# Patient Record
Sex: Female | Born: 1960
Health system: Southern US, Community
[De-identification: ages and names within clinical notes are randomized; demographics above are authoritative.]

## PROBLEM LIST (undated history)

## (undated) DIAGNOSIS — R51 Headache: Secondary | ICD-10-CM

## (undated) DIAGNOSIS — K219 Gastro-esophageal reflux disease without esophagitis: Secondary | ICD-10-CM

## (undated) DIAGNOSIS — F32A Depression, unspecified: Secondary | ICD-10-CM

## (undated) DIAGNOSIS — D689 Coagulation defect, unspecified: Secondary | ICD-10-CM

## (undated) DIAGNOSIS — E079 Disorder of thyroid, unspecified: Secondary | ICD-10-CM

## (undated) DIAGNOSIS — Z8489 Family history of other specified conditions: Secondary | ICD-10-CM

## (undated) DIAGNOSIS — D219 Benign neoplasm of connective and other soft tissue, unspecified: Secondary | ICD-10-CM

## (undated) DIAGNOSIS — R112 Nausea with vomiting, unspecified: Secondary | ICD-10-CM

## (undated) DIAGNOSIS — R200 Anesthesia of skin: Secondary | ICD-10-CM

## (undated) DIAGNOSIS — R519 Headache, unspecified: Secondary | ICD-10-CM

## (undated) DIAGNOSIS — F329 Major depressive disorder, single episode, unspecified: Secondary | ICD-10-CM

## (undated) DIAGNOSIS — I82409 Acute embolism and thrombosis of unspecified deep veins of unspecified lower extremity: Secondary | ICD-10-CM

## (undated) DIAGNOSIS — Z9889 Other specified postprocedural states: Secondary | ICD-10-CM

## (undated) DIAGNOSIS — R42 Dizziness and giddiness: Secondary | ICD-10-CM

## (undated) DIAGNOSIS — E039 Hypothyroidism, unspecified: Secondary | ICD-10-CM

## (undated) HISTORY — PX: TENDON RELEASE: SHX230

## (undated) HISTORY — PX: CHOLECYSTECTOMY: SHX55

## (undated) HISTORY — DX: Coagulation defect, unspecified: D68.9

## (undated) HISTORY — DX: Disorder of thyroid, unspecified: E07.9

## (undated) HISTORY — PX: ABDOMINAL HYSTERECTOMY: SHX81

## (undated) HISTORY — PX: TONSILLECTOMY: SHX5217

## (undated) HISTORY — PX: OOPHORECTOMY: SHX86

## (undated) HISTORY — PX: VENA CAVA FILTER PLACEMENT: SUR1032

---

## 1997-06-21 ENCOUNTER — Emergency Department (HOSPITAL_COMMUNITY): Admission: EM | Admit: 1997-06-21 | Discharge: 1997-06-21 | Payer: Self-pay | Admitting: Emergency Medicine

## 1997-06-21 ENCOUNTER — Inpatient Hospital Stay (HOSPITAL_COMMUNITY): Admission: RE | Admit: 1997-06-21 | Discharge: 1997-06-23 | Payer: Self-pay | Admitting: Internal Medicine

## 1997-07-25 ENCOUNTER — Ambulatory Visit (HOSPITAL_COMMUNITY): Admission: RE | Admit: 1997-07-25 | Discharge: 1997-07-25 | Payer: Self-pay | Admitting: Obstetrics and Gynecology

## 1997-08-08 ENCOUNTER — Inpatient Hospital Stay (HOSPITAL_COMMUNITY): Admission: RE | Admit: 1997-08-08 | Discharge: 1997-08-09 | Payer: Self-pay | Admitting: Orthopedic Surgery

## 1997-10-31 ENCOUNTER — Ambulatory Visit (HOSPITAL_BASED_OUTPATIENT_CLINIC_OR_DEPARTMENT_OTHER): Admission: RE | Admit: 1997-10-31 | Discharge: 1997-10-31 | Payer: Self-pay | Admitting: Orthopedic Surgery

## 1998-07-13 ENCOUNTER — Emergency Department (HOSPITAL_COMMUNITY): Admission: EM | Admit: 1998-07-13 | Discharge: 1998-07-14 | Payer: Self-pay | Admitting: Emergency Medicine

## 1998-08-07 ENCOUNTER — Encounter (INDEPENDENT_AMBULATORY_CARE_PROVIDER_SITE_OTHER): Payer: Self-pay

## 1998-08-07 ENCOUNTER — Inpatient Hospital Stay (HOSPITAL_COMMUNITY): Admission: RE | Admit: 1998-08-07 | Discharge: 1998-08-12 | Payer: Self-pay | Admitting: Obstetrics & Gynecology

## 1998-12-02 ENCOUNTER — Encounter: Admission: RE | Admit: 1998-12-02 | Discharge: 1999-02-10 | Payer: Self-pay | Admitting: Internal Medicine

## 1999-04-28 ENCOUNTER — Emergency Department (HOSPITAL_COMMUNITY): Admission: EM | Admit: 1999-04-28 | Discharge: 1999-04-28 | Payer: Self-pay | Admitting: *Deleted

## 1999-04-29 ENCOUNTER — Encounter: Payer: Self-pay | Admitting: *Deleted

## 1999-08-07 ENCOUNTER — Ambulatory Visit (HOSPITAL_COMMUNITY): Admission: RE | Admit: 1999-08-07 | Discharge: 1999-08-07 | Payer: Self-pay | Admitting: Obstetrics and Gynecology

## 1999-08-07 ENCOUNTER — Encounter (INDEPENDENT_AMBULATORY_CARE_PROVIDER_SITE_OTHER): Payer: Self-pay

## 2000-01-21 ENCOUNTER — Emergency Department (HOSPITAL_COMMUNITY): Admission: EM | Admit: 2000-01-21 | Discharge: 2000-01-21 | Payer: Self-pay | Admitting: Emergency Medicine

## 2000-01-22 ENCOUNTER — Encounter: Payer: Self-pay | Admitting: Emergency Medicine

## 2001-01-25 ENCOUNTER — Other Ambulatory Visit: Admission: RE | Admit: 2001-01-25 | Discharge: 2001-01-25 | Payer: Self-pay | Admitting: Obstetrics and Gynecology

## 2001-02-17 ENCOUNTER — Ambulatory Visit (HOSPITAL_COMMUNITY): Admission: RE | Admit: 2001-02-17 | Discharge: 2001-02-17 | Payer: Self-pay | Admitting: Obstetrics and Gynecology

## 2001-02-17 ENCOUNTER — Encounter (INDEPENDENT_AMBULATORY_CARE_PROVIDER_SITE_OTHER): Payer: Self-pay

## 2001-05-08 ENCOUNTER — Inpatient Hospital Stay (HOSPITAL_COMMUNITY): Admission: EM | Admit: 2001-05-08 | Discharge: 2001-05-12 | Payer: Self-pay | Admitting: Emergency Medicine

## 2001-05-09 ENCOUNTER — Encounter: Payer: Self-pay | Admitting: Internal Medicine

## 2001-06-19 ENCOUNTER — Emergency Department (HOSPITAL_COMMUNITY): Admission: EM | Admit: 2001-06-19 | Discharge: 2001-06-19 | Payer: Self-pay | Admitting: Emergency Medicine

## 2001-07-08 ENCOUNTER — Ambulatory Visit (HOSPITAL_COMMUNITY): Admission: RE | Admit: 2001-07-08 | Discharge: 2001-07-08 | Payer: Self-pay | Admitting: Internal Medicine

## 2001-07-08 ENCOUNTER — Encounter: Payer: Self-pay | Admitting: Internal Medicine

## 2001-07-20 ENCOUNTER — Ambulatory Visit: Admission: RE | Admit: 2001-07-20 | Discharge: 2001-07-20 | Payer: Self-pay | Admitting: Gynecology

## 2001-07-25 ENCOUNTER — Ambulatory Visit (HOSPITAL_COMMUNITY): Admission: RE | Admit: 2001-07-25 | Discharge: 2001-07-25 | Payer: Self-pay | Admitting: Internal Medicine

## 2001-07-26 ENCOUNTER — Ambulatory Visit (HOSPITAL_COMMUNITY): Admission: RE | Admit: 2001-07-26 | Discharge: 2001-07-26 | Payer: Self-pay | Admitting: Internal Medicine

## 2001-07-28 ENCOUNTER — Encounter: Payer: Self-pay | Admitting: Internal Medicine

## 2001-07-28 ENCOUNTER — Ambulatory Visit (HOSPITAL_COMMUNITY): Admission: RE | Admit: 2001-07-28 | Discharge: 2001-07-28 | Payer: Self-pay | Admitting: Internal Medicine

## 2001-08-09 ENCOUNTER — Ambulatory Visit: Admission: RE | Admit: 2001-08-09 | Discharge: 2001-08-09 | Payer: Self-pay | Admitting: Internal Medicine

## 2001-09-20 ENCOUNTER — Encounter: Payer: Self-pay | Admitting: Internal Medicine

## 2001-09-20 ENCOUNTER — Encounter: Admission: RE | Admit: 2001-09-20 | Discharge: 2001-09-20 | Payer: Self-pay | Admitting: Internal Medicine

## 2001-11-02 ENCOUNTER — Observation Stay (HOSPITAL_COMMUNITY): Admission: EM | Admit: 2001-11-02 | Discharge: 2001-11-03 | Payer: Self-pay | Admitting: *Deleted

## 2002-03-03 ENCOUNTER — Other Ambulatory Visit: Admission: RE | Admit: 2002-03-03 | Discharge: 2002-03-03 | Payer: Self-pay | Admitting: Internal Medicine

## 2002-03-27 ENCOUNTER — Emergency Department (HOSPITAL_COMMUNITY): Admission: EM | Admit: 2002-03-27 | Discharge: 2002-03-28 | Payer: Self-pay | Admitting: Emergency Medicine

## 2002-12-19 ENCOUNTER — Ambulatory Visit (HOSPITAL_COMMUNITY): Admission: RE | Admit: 2002-12-19 | Discharge: 2002-12-19 | Payer: Self-pay | Admitting: Internal Medicine

## 2003-04-05 ENCOUNTER — Emergency Department (HOSPITAL_COMMUNITY): Admission: AD | Admit: 2003-04-05 | Discharge: 2003-04-05 | Payer: Self-pay | Admitting: Family Medicine

## 2003-04-07 ENCOUNTER — Emergency Department (HOSPITAL_COMMUNITY): Admission: AD | Admit: 2003-04-07 | Discharge: 2003-04-07 | Payer: Self-pay | Admitting: Family Medicine

## 2003-04-09 ENCOUNTER — Encounter: Admission: RE | Admit: 2003-04-09 | Discharge: 2003-04-09 | Payer: Self-pay | Admitting: Internal Medicine

## 2003-04-11 ENCOUNTER — Encounter: Admission: RE | Admit: 2003-04-11 | Discharge: 2003-04-11 | Payer: Self-pay | Admitting: Internal Medicine

## 2003-08-11 ENCOUNTER — Emergency Department (HOSPITAL_COMMUNITY): Admission: EM | Admit: 2003-08-11 | Discharge: 2003-08-12 | Payer: Self-pay | Admitting: Emergency Medicine

## 2004-06-06 ENCOUNTER — Other Ambulatory Visit: Admission: RE | Admit: 2004-06-06 | Discharge: 2004-06-06 | Payer: Self-pay | Admitting: Internal Medicine

## 2004-09-07 ENCOUNTER — Emergency Department (HOSPITAL_COMMUNITY): Admission: EM | Admit: 2004-09-07 | Discharge: 2004-09-07 | Payer: Self-pay | Admitting: *Deleted

## 2004-09-08 ENCOUNTER — Ambulatory Visit (HOSPITAL_COMMUNITY): Admission: RE | Admit: 2004-09-08 | Discharge: 2004-09-08 | Payer: Self-pay | Admitting: *Deleted

## 2004-09-10 ENCOUNTER — Encounter: Admission: RE | Admit: 2004-09-10 | Discharge: 2004-09-10 | Payer: Self-pay | Admitting: Internal Medicine

## 2004-11-24 ENCOUNTER — Ambulatory Visit: Payer: Self-pay | Admitting: Oncology

## 2004-12-27 ENCOUNTER — Encounter: Admission: RE | Admit: 2004-12-27 | Discharge: 2004-12-27 | Payer: Self-pay | Admitting: Vascular Surgery

## 2005-01-26 ENCOUNTER — Ambulatory Visit (HOSPITAL_COMMUNITY): Admission: RE | Admit: 2005-01-26 | Discharge: 2005-01-26 | Payer: Self-pay | Admitting: Vascular Surgery

## 2005-02-02 ENCOUNTER — Emergency Department (HOSPITAL_COMMUNITY): Admission: EM | Admit: 2005-02-02 | Discharge: 2005-02-03 | Payer: Self-pay | Admitting: Emergency Medicine

## 2005-02-18 ENCOUNTER — Encounter: Admission: RE | Admit: 2005-02-18 | Discharge: 2005-02-18 | Payer: Self-pay | Admitting: Internal Medicine

## 2005-02-18 ENCOUNTER — Encounter (INDEPENDENT_AMBULATORY_CARE_PROVIDER_SITE_OTHER): Payer: Self-pay | Admitting: *Deleted

## 2005-02-28 ENCOUNTER — Inpatient Hospital Stay (HOSPITAL_COMMUNITY): Admission: AD | Admit: 2005-02-28 | Discharge: 2005-02-28 | Payer: Self-pay | Admitting: Gynecology

## 2005-03-11 ENCOUNTER — Ambulatory Visit: Payer: Self-pay | Admitting: Oncology

## 2005-08-17 ENCOUNTER — Encounter: Admission: RE | Admit: 2005-08-17 | Discharge: 2005-08-17 | Payer: Self-pay | Admitting: Internal Medicine

## 2005-10-25 ENCOUNTER — Emergency Department (HOSPITAL_COMMUNITY): Admission: EM | Admit: 2005-10-25 | Discharge: 2005-10-25 | Payer: Self-pay | Admitting: Emergency Medicine

## 2005-11-23 ENCOUNTER — Emergency Department (HOSPITAL_COMMUNITY): Admission: EM | Admit: 2005-11-23 | Discharge: 2005-11-23 | Payer: Self-pay | Admitting: Emergency Medicine

## 2005-12-21 ENCOUNTER — Ambulatory Visit (HOSPITAL_COMMUNITY): Admission: RE | Admit: 2005-12-21 | Discharge: 2005-12-21 | Payer: Self-pay | Admitting: General Surgery

## 2005-12-24 ENCOUNTER — Ambulatory Visit (HOSPITAL_COMMUNITY): Admission: RE | Admit: 2005-12-24 | Discharge: 2005-12-24 | Payer: Self-pay | Admitting: General Surgery

## 2005-12-30 ENCOUNTER — Encounter: Admission: RE | Admit: 2005-12-30 | Discharge: 2006-03-30 | Payer: Self-pay | Admitting: General Surgery

## 2005-12-31 ENCOUNTER — Emergency Department (HOSPITAL_COMMUNITY): Admission: EM | Admit: 2005-12-31 | Discharge: 2006-01-01 | Payer: Self-pay | Admitting: Emergency Medicine

## 2006-02-02 ENCOUNTER — Emergency Department (HOSPITAL_COMMUNITY): Admission: EM | Admit: 2006-02-02 | Discharge: 2006-02-02 | Payer: Self-pay | Admitting: Emergency Medicine

## 2006-04-06 ENCOUNTER — Ambulatory Visit (HOSPITAL_COMMUNITY): Admission: RE | Admit: 2006-04-06 | Discharge: 2006-04-07 | Payer: Self-pay | Admitting: General Surgery

## 2006-04-06 HISTORY — PX: LAPAROSCOPIC GASTRIC BANDING: SHX1100

## 2006-04-13 ENCOUNTER — Encounter: Admission: RE | Admit: 2006-04-13 | Discharge: 2006-07-12 | Payer: Self-pay | Admitting: General Surgery

## 2006-05-27 ENCOUNTER — Ambulatory Visit: Payer: Self-pay | Admitting: *Deleted

## 2006-05-27 ENCOUNTER — Emergency Department (HOSPITAL_COMMUNITY): Admission: EM | Admit: 2006-05-27 | Discharge: 2006-05-27 | Payer: Self-pay | Admitting: Emergency Medicine

## 2006-05-27 ENCOUNTER — Encounter (INDEPENDENT_AMBULATORY_CARE_PROVIDER_SITE_OTHER): Payer: Self-pay | Admitting: *Deleted

## 2006-08-23 ENCOUNTER — Inpatient Hospital Stay (HOSPITAL_COMMUNITY): Admission: AD | Admit: 2006-08-23 | Discharge: 2006-08-23 | Payer: Self-pay | Admitting: Obstetrics and Gynecology

## 2006-09-06 ENCOUNTER — Encounter: Admission: RE | Admit: 2006-09-06 | Discharge: 2006-09-06 | Payer: Self-pay | Admitting: General Surgery

## 2006-09-16 ENCOUNTER — Ambulatory Visit: Payer: Self-pay | Admitting: Gynecology

## 2006-11-08 ENCOUNTER — Inpatient Hospital Stay (HOSPITAL_COMMUNITY): Admission: RE | Admit: 2006-11-08 | Discharge: 2006-11-11 | Payer: Self-pay | Admitting: Gynecology

## 2006-11-08 ENCOUNTER — Encounter (INDEPENDENT_AMBULATORY_CARE_PROVIDER_SITE_OTHER): Payer: Self-pay | Admitting: Gynecology

## 2006-11-08 ENCOUNTER — Ambulatory Visit: Payer: Self-pay | Admitting: Gynecology

## 2006-11-18 ENCOUNTER — Ambulatory Visit: Payer: Self-pay | Admitting: Obstetrics & Gynecology

## 2006-12-05 ENCOUNTER — Inpatient Hospital Stay (HOSPITAL_COMMUNITY): Admission: AD | Admit: 2006-12-05 | Discharge: 2006-12-05 | Payer: Self-pay | Admitting: Gynecology

## 2006-12-10 ENCOUNTER — Ambulatory Visit: Payer: Self-pay | Admitting: Gynecology

## 2006-12-21 ENCOUNTER — Ambulatory Visit (HOSPITAL_COMMUNITY): Admission: RE | Admit: 2006-12-21 | Discharge: 2006-12-21 | Payer: Self-pay | Admitting: Gynecology

## 2006-12-24 ENCOUNTER — Encounter: Admission: RE | Admit: 2006-12-24 | Discharge: 2006-12-24 | Payer: Self-pay | Admitting: Gynecology

## 2007-01-09 ENCOUNTER — Emergency Department (HOSPITAL_COMMUNITY): Admission: EM | Admit: 2007-01-09 | Discharge: 2007-01-09 | Payer: Self-pay | Admitting: Emergency Medicine

## 2007-03-23 ENCOUNTER — Emergency Department (HOSPITAL_COMMUNITY): Admission: EM | Admit: 2007-03-23 | Discharge: 2007-03-24 | Payer: Self-pay | Admitting: Family Medicine

## 2007-05-23 ENCOUNTER — Encounter: Admission: RE | Admit: 2007-05-23 | Discharge: 2007-05-23 | Payer: Self-pay | Admitting: Internal Medicine

## 2007-06-05 ENCOUNTER — Emergency Department (HOSPITAL_COMMUNITY): Admission: EM | Admit: 2007-06-05 | Discharge: 2007-06-06 | Payer: Self-pay | Admitting: Emergency Medicine

## 2008-08-31 ENCOUNTER — Emergency Department (HOSPITAL_COMMUNITY): Admission: EM | Admit: 2008-08-31 | Discharge: 2008-08-31 | Payer: Self-pay | Admitting: Family Medicine

## 2008-10-24 ENCOUNTER — Encounter: Admission: RE | Admit: 2008-10-24 | Discharge: 2008-10-24 | Payer: Self-pay | Admitting: General Surgery

## 2009-07-18 ENCOUNTER — Ambulatory Visit (HOSPITAL_COMMUNITY): Admission: RE | Admit: 2009-07-18 | Discharge: 2009-07-18 | Payer: Self-pay | Admitting: Internal Medicine

## 2010-02-09 ENCOUNTER — Encounter: Payer: Self-pay | Admitting: Gynecology

## 2010-02-09 ENCOUNTER — Encounter: Payer: Self-pay | Admitting: Internal Medicine

## 2010-04-26 LAB — POCT URINALYSIS DIP (DEVICE)
Glucose, UA: NEGATIVE mg/dL
Nitrite: NEGATIVE
Urobilinogen, UA: 0.2 mg/dL (ref 0.0–1.0)
pH: 7 (ref 5.0–8.0)

## 2010-04-26 LAB — POCT I-STAT, CHEM 8
Creatinine, Ser: 0.8 mg/dL (ref 0.4–1.2)
HCT: 44 % (ref 36.0–46.0)
Sodium: 139 mEq/L (ref 135–145)
TCO2: 28 mmol/L (ref 0–100)

## 2010-06-03 NOTE — Discharge Summary (Signed)
Laura Molina, Laura Molina NO.:  [id number]   MEDICAL RECORD NO.:  [id number]          PATIENT TYPE:  INP   LOCATION:  9312                          FACILITY:  [facility]   PHYSICIAN:  Ginger Carne, MD  DATE OF BIRTH:  11-26-1960   DATE OF ADMISSION:  11/08/2006  DATE OF DISCHARGE:  11/11/2006                               DISCHARGE SUMMARY   REASON FOR HOSPITALIZATION:  Chronic left lower quadrant pain, retained  left ovarian syndrome.   PROCEDURES:  Laparotomy followed by left oophorectomy, placement of left  ureteral stent by cystoscopy and repair of umbilical hernia.   DISCHARGE DIAGNOSES:  1. Laparotomy followed by left oophorectomy, placement of left      ureteral stent by cystoscopy.  2. Umbilical hernia, repaired.   HOSPITAL COURSE:  This patient is a 50 year old, Hispanic female who  underwent the aforementioned surgery on November 08, 2006.  She had a  previous hysterectomy and right salpingo-oophorectomy.  The patient's  intraoperative course was uncomplicated.  She did have a 6 cm,  multicystic, left ovary significantly adherent to the rectosigmoid colon  and pelvic sidewall.  The pathology report demonstrated benign cyst.  Her lap band was in place and her umbilical hernia was repaired with  interrupted, 1-Prolene sutures without the necessity of a graft.  She  has a history of two deep venous thromboses and had been on Coumadin  prophylaxis prior to surgery.   Her postoperative course was remarkable.  She was afebrile.  Her abdomen  was soft and incision dry.  She had flatus on discharge, but no bowel  movement and she had good bowel sounds.  Her calves were without  tenderness and lungs were clear.  She was started on alternating regimen  of 7.5 mg and 5 mg respectively of Coumadin every night.  At the time of  her discharge, her INR was 1.5.  Her hemoglobin was 10.4.  She was also  placed on Lovenox at 1 mg/kg subcutaneously in two divided doses  every  12 hours.   The patient was discharged with instructions to take 7.5 mg of Coumadin  this evening and she has an appointment with Dr. Nehemiah Settle at 2:45 p.m.  tomorrow, November 12, 2006.  At that time, management of her Lovenox and  Coumadin and INR testing will be taken over.  She was instructed to  continue on Lovenox 40 mg subcutaneously every 12 hours until further  instructions by Dr. Nehemiah Settle.  Lortab elixir 7.5/500 was provided at 15 mL  q.8 h. as needed x5 days with one refill.   SPECIAL INSTRUCTIONS:  Routine postoperative instructions were provided  including contacting the office for temperature elevation above 100.4  degrees Fahrenheit, increasing abdominal pain, incisional drainage,  bleeding or significant erythema and wound separation.  In addition,  increased calf pain and/or abdominal pain.  GI and GU complaints were  also discussed and the patient advised to contact the office for same.   FOLLOW UP:  She will be seen in the GYN clinic for which she will make  an appointment on October 30, to have her  staples removed and then a 4-  week postoperative visit.   ACTIVITY:  She was advised to avoid heavy lifting and driving x1 week.   All questions answered to the satisfaction of said patient and the  patient verbalized understanding of same.      Ginger Carne, MD  Electronically Signed     SHB/MEDQ  D:  11/11/2006  T:  11/12/2006  Job:  [id number]

## 2010-06-03 NOTE — Op Note (Signed)
NAMEMEDA, DUDZINSKI NO.:  [id number]   MEDICAL RECORD NO.:  [id number]          PATIENT TYPE:  INP   LOCATION:  9312                          FACILITY[facility]  [facility]   PHYSICIAN:  Ginger Carne, MD  DATE OF BIRTH:  13-Mar-1960   DATE OF PROCEDURE:  11/08/2006  DATE OF DISCHARGE:                               OPERATIVE REPORT   PREOPERATIVE DIAGNOSIS:  Retained left ovarian syndrome, chronic pelvic  pain.   POSTOPERATIVE DIAGNOSIS:  Retained left ovarian syndrome, chronic pelvic  pain, and umbilical hernia.   PROCEDURE:  Laparotomy with a left oophorectomy, placement of ureteral  catheter via cystoscopy, and repair of umbilical hernia.   SURGEON:  Dr. Mia Creek.   ASSISTANT:  Dr. Okey Dupre.   ESTIMATED BLOOD LOSS:  200 mL.   COMPLICATIONS:  None immediate.   SPECIMEN:  Left ovary to pathology.   OPERATIVE FINDINGS:  The patient had a previous excised right tube and  ovary and uterus and cervix.  The left ovary was densely adherent to its  respective sidewall.  It measured approximately 6X4X4 cm. without  surface excrescenses. It was multilocular and no ascites noted. Pelvic  and para aortic nodes were palpated and not enlarged. The left ureter  was always in view during the critical portions of the dissection of the  left ovary and clamping of vessels.  The patient had a 4-cm umbilical  hernia.  The lap band that the patient had placed previously was noted.  Minimal dissection to free up adhesions of the excess portion of the  band from the incisional hernia site was required.  The remainder of the  large and small bowel was grossly normal.  There was adherence of the  rectosigmoid colon to the left ovary.   OPERATIVE PROCEDURE:  The patient prepped and draped in usual fashion  and placed in lithotomy position.  Betadine solution used for  antiseptic, and the patient was catheterized prior to the procedure.  After adequate general anesthesia, a cystoscopy was  performed.  The  urethra and bladder appeared normal.  A #5-French straight ureteral  catheter was placed through the left ureteral orifice and was  facilitated by the placement of a guidewire first.  A Foley catheter was  then introduced through the urethra into the bladder and the ureteral  catheter affixed to the Foley catheter during the operative portion of  the procedure.  The ureteral catheter was removed and the Foley replaced  at the end of the procedure.  Following this, the patient reprepped and  positioned, and a vertical infraumbilical incision was made and the  abdomen opened.  Self-retaining retractors were utilized.  The lateral  wall of the left pelvis was opened, and the ureter was immediately  identified both by visualization and by palpation.  Following this, the  left infundibulopelvic ligament was identified after dissection of bowel  from the left ovary.  Two-0 Vicryl suture ties were placed as a  transfixation method around the left IP ligament.  The ligament was cut  on the ovary side.  Careful sharp and blunt dissection was then carried  out to remove said ovary.  Bleeding points were meticulously checked.  The umbilical hernia was repaired by opening said hernia, removing the  umbilical sac and using a series of 1 Prolene interrupted sutures.  Afterwards, copious irrigation with lactated Ringer's was used, irrigant  removed.  As mentioned above, the previously placed lap band around the  stomach was identified and no injury to said structure noted.  Using a  double looped 0 PDS running suture for closure of the fascia.  More  irrigation for the subcutaneous layer and closure with staples for the  skin.  The patient tolerated the procedure well, returned to the post-  anesthesia recovery room in excellent condition.      Ginger Carne, MD  Electronically Signed     SHB/MEDQ  D:  11/08/2006  T:  11/09/2006  Job:  [id number]

## 2010-06-06 NOTE — Op Note (Signed)
NAMEZAKARIA, FROMER NO.:  [id number]   MEDICAL RECORD NO.:  [id number]          PATIENT TYPE:  OIB   LOCATION:  1516                         FACILITY:  [facility]   PHYSICIAN:  Sharlet Salina T. Hoxworth, M.D.DATE OF BIRTH:  12/14/1960   DATE OF PROCEDURE:  04/06/2006  DATE OF DISCHARGE:                               OPERATIVE REPORT   PREOPERATIVE DIAGNOSIS:  Morbid obesity.   POSTOPERATIVE DIAGNOSIS:  Morbid obesity and hiatal hernia.   SURGICAL PROCEDURES:  Laparoscopic adjustable gastric band placement  with hiatal hernia repair.   SURGEON:  Lorne Skeens. Hoxworth, M.D.   ASSISTANT:  Sandria Bales. Ezzard Standing, M.D.   ANESTHESIA:  General.   BRIEF HISTORY:  Ms. Klich is a 50 year old female with progressive  morbid obesity unresponsive to medical management.  Following a thorough  preoperative evaluation and workup detailed elsewhere, we have elected  to proceed with placement of laparoscopic adjustable gastric band.  Her  preoperative upper GI series did indicate a small sliding hiatal hernia.   DESCRIPTION OF OPERATION:  The patient was brought to the operating room  and placed in the supine position on the operating table and general  orotracheal anesthesia was induced.  The abdomen was widely sterilely  prepped and draped.  The correct patient and procedure were verified.  She had been given preoperative IV antibiotics and Lovenox.  Local  anesthesia was used to infiltrate the trocar sites.  Access was obtained  with the 11 mm OptiVu trocar in the left lower quadrant and  pneumoperitoneum established.  Under direct vision, a 15 mm trocar was  placed through the falciform ligament in the right upper quadrant, an 11  mm trocar in the right mid abdomen, another 11 mm trocar in the left  periumbilical space for the camera port, and a 5 mm trocar in the left  flank.  Through a 5 mm site in the epigastrium, the Nathanson retractor  was placed and the left lobe of the  liver elevated with excellent  exposure of the hiatus and stomach.  Initially, the angle of His was  dissected and the left crus identified and blunt dissection carried down  along the left crus toward the retrogastric space.  The pars flaccida  was then opened in the avascular area and the base of the right crus  identified.  The calibration tube was then passed into the stomach.  The  balloon was inflated to 15 mm and pulled back and did go up through the  hiatus indicating a small hiatal hernia.  The crura were then dissected  posteriorly and the posterior confluence of the left and right crura  were identified and above this was a small hiatal hernia.  This was  repaired with a single 0 Ethibond pledgeted suture.  The calibration  tube was then passed back down through the hiatus without difficulty.  It was pulled back to the esophagus and the dissector was passed in the  retrogastric space anterior to the base of the right crus and the tip  brought out through the previously dissected area along the angle of His  without  difficulty.  An AP standard lap band system was introduced,  tubing passed into the finger dissector, and then brought back behind  the stomach, and the band brought back behind the stomach without  difficulty.  The tubing was passed through the band and with the  calibration tube in place, the band was locked without difficulty.  The  calibration tube was removed without difficulty.  Holding the tubing  toward the patient's right foot, the fundus was imbricated up over the  band to a small gastric pouch with several interrupted 2-0 Ethilon  sutures.  The band appeared to be in an excellent position.  Hemostasis  was assured.  The Nathanson retractor was removed and the tube brought  out through the right mid abdominal site and the trocars and all CO2  removed.  The incision was lengthened somewhat for placement of the port  and the anterior abdominal wall exposed.   Four 2-0 Prolene sutures were  placed and after attaching the port to the tubing, it was sutured to the  anterior abdominal wall with these sutures.  The wounds were irrigated.  The skin closed with staples.  Sponge, needle, and instrument counts  were correct.  Dry, sterile dressings were applied and the patient taken  to recovery in good condition.      Lorne Skeens. Hoxworth, M.D.  Electronically Signed     BTH/MEDQ  D:  04/06/2006  T:  04/06/2006  Job:  [id number]

## 2010-06-06 NOTE — Op Note (Signed)
[facility]  Patient:    Laura Molina, Laura Molina Visit Number: [id number] MRN: [id number]          Service Type: DSU Location: [facility] Attending Physician:  Osborn Coho Dictated by:   Janeece Riggers Dareen Piano, M.D. Proc. Date: 02/17/01 Admit Date:  02/17/2001                             Operative Report  PREOPERATIVE DIAGNOSES:       1. Left ovarian cyst.                               2. Left lower quadrant pain.  SURGEON:                      Mark E. Dareen Piano, M.D.  ANESTHESIA                    General endotracheal.  ANTIBIOTICS:                  Ancef 1 g.  DRAINS:                       Red rubber catheter to bladder.  COMPLICATIONS:                None.  SPECIMEN:                     Left ovarian cyst.  PROCEDURE:                    Patient was taken to the operating room where she was placed in a dorsal supine position.  A general endotracheal anesthetic was administered.  She was then placed in the dorsal lithotomy position and prepped with Hibiclens.  Her bladder was drained with a Red rubber catheter. She was then draped in the usual fashion for this procedure.  Her umbilicus was injected with 0.25% Marcaine.  A vertical skin incision was made.  This was carried down to the fascia.  Fascia was grasped with Allis clamps and an incision was made.  Parietoperitoneum was entered bluntly.  The Hasson cannula was placed into the abdominal cavity and the abdominal cavity was then insufflated with 3 L of carbon dioxide.  At this point the patient was noted to have adhesions throughout the entire lower abdominal wall.  This involved one loop of small bowel.  These were all taken down with sharp dissection. Once this was accomplished the ovarian cyst on the left could be identified. The ovary was densely adherent to the pelvic side wall and puncture sites in the right and left lower quadrants were made using a 5 mm trocar.  At this point instruments  were used to grasp the ovary and dissect away the colon from the ovary.  Once this was accomplished there were still areas of omentum which were stuck to the ovary which were also dissected away.  The ovary was also densely adherent to the pelvic side wall superiorly and inferiorly.  Despite extensive attempts at trying to identify the infundibulopelvic ligament and free the adhesions, this was unsuccessful.  Because the patient had previously been given heparin, several areas of oozing made it more difficult to complete the dissection.  Also, the ovary appeared to be adherent to the iliac vessel making dissection even  more difficult.  It was felt that further dissection would put the patient in jeopardy and therefore an ovarian cystectomy was performed and the ovary left in place.  At this point the procedure was concluded, the instruments removed, pneumoperitoneum released.  Fascia was closed with 0 Vicryl suture and the skin incision with 4-0 Vicryl suture. Patient tolerated the procedure well.  She was taken to the recovery room in stable condition.  She will be discharged to home with Lovenox 40 mg subcutaneously b.i.d. for three days and Tylox to take p.r.n.  She will follow up in the office in four weeks. Dictated by:   Janeece Riggers Dareen Piano, M.D. Attending Physician:  Osborn Coho DD:  02/17/01 TD:  02/17/01 Job: [id number]

## 2010-06-06 NOTE — H&P (Signed)
[place]  Patient:    Laura Molina, Laura Molina Visit Number: [id number] MRN: [id number]          Service Type: MED Location: [id number] Attending Physician:  Mayra Neer Dictated by:   Lester Kinsman, M.D. Admit Date:  05/08/2001 Discharge Date: 05/12/2001                           History and Physical  DATE OF BIRTH:  1960-07-20.  PRIMARY PHYSICIAN:  Lilla Shook, M.D.  EAGLE PHYSICIAN:  Sigmund I. Patsi Sears, M.D. as the primary.  PROBLEM LIST: 1. Left lower extremity deep venous thrombosis confirmed by duplex    ultrasound showing a DVT in the superficial femoral vein and midway of    the popliteal vein.  No evidence of superficial vena thrombosis or    Baker cyst.    A. History of DVT in 1989, 1999, and 2000. 2. Morbid obesity. 3. Hypothyroidism. 4. History of peptic ulcer disease. 5. Status post laparoscopic ovarian cyst removal more than six months ago.  HISTORY OF PRESENT ILLNESS:  This 50 year old Hispanic female, with a history of DVTs in the past, who presents with a swollen, tender left lower extremity that has been progressively worse over the last three days.  She denies any shortness of breath or chest pain.  No trauma to the area.  She has no history of recent prolonged travel.  She has no history of clotting disorders, either personally or in her family.  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  PAST MEDICAL HISTORY AND SURGICAL HISTORY:  Per her problem list.  MEDICATIONS:  She is on Synthroid.  ALLERGIES:  IBUPROFEN which causes a rash, and DARVOCET which causes jitteriness.  SOCIAL HISTORY:  She is married.  She has four children.  She works as a Occupational psychologist.  She does not drink or smoke.  She does not use drugs.  FAMILY HISTORY:  No history of clotting of bleeding disorder.  No history of miscarriages.  REVIEW OF SYSTEMS:  No chest pain, shortness of  breath.  No bowel or bladder dysfunction.  No headache.  No miscarriages.  She does have a 14-pound weight loss over the last two months, but she is on a Weight Watchers program and is exercising regularly.  PHYSICAL EXAMINATION:  VITAL SIGNS:  Temperature 97 degrees, BP 166/86, pulse 59, respirations 18, O2 saturation is 96% on room air.  GENERAL:  She is an obese, Hispanic female in no acute distress, lying in bed.  HEENT:  Oropharynx is clear.  EOMI.  Pupils are reactive.  NECK:  Thick and supple with no bruits appreciated.  CARDIOVASCULAR:  Regular rate and rhythm.  No murmurs.  Normal S1 and S2.  LUNGS:  Clear to auscultation bilaterally.  ABDOMEN:  Obese, nontender.  Active bowel sounds.  EXTREMITIES:  Left lower extremity is swollen.  It is tender and warm.  She has a positive Homans sign.  Right extremity is normal.  NEUROLOGIC:  Examination is nonfocal.  She moves all four of her extremities.  GASTROINTESTINAL:  Guaiac negative.  LABORATORY DATA:  Sodium 138, potassium 3.6, chloride 104, CO2 of 25, BUN 12, creatinine 0.9, glucose 106.  White count is 9.9 thousand, hemoglobin 14.0, platelets 273.  PT 13.3, INR 1.0.  As noted a duplex ultrasound of the left lower extremity shows a DVT in the ______ , SFV, and  the popliteal vein.  No evidence of superficial vena thrombosis or Baker cyst.  ASSESSMENT AND PLAN: 1. Deep venous thrombosis.  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  She    has no obvious risk factors; for example, recent surgery, pregnancy,    trauma, or immobility, but she does sit all day long at her desk job.    However, she walks 2-3 miles a day three days a week in conjunction with    her Weight Watchers loss program.  She has no estrogen use.  There is a    question as to whether this is a lupus anticoagulant or anticardiolipin    syndrome.  She does not have, however, a  history of miscarriages or other    clotting disorders.  PLAN: 1. IV heparin, pharmacy to dose. 2. We will start Coumadin in the next one to three days for a target INR of    2-3. 3. Pain control. 4. We will check a protein C and protein S, antithrombin 3 and anticardiolipin    antibody, as well as factor V Leiden.  Dr. Lovell Sheehan will follow up in the    morning and follow up with the labs. Dictated by:   Lester Kinsman, M.D. Attending Physician:  Mayra Neer DD:  05/09/01 TD:  05/09/01 Job: [id number]

## 2010-06-06 NOTE — Op Note (Signed)
[facility]  Patient:    Laura Molina, Laura Molina                      MRN: [id number] Proc. Date: 08/07/99 Adm. Date:  [id number] Disc. Date: [id number] Attending:  Osborn Coho                           Operative Report  PREOPERATIVE DIAGNOSIS:  Chronic right lower quadrant pain.  POSTOPERATIVE DIAGNOSIS:  Chronic right lower quadrant pain.  PROCEDURES: 1. Open laparoscopy. 2. Lysis of extensive adhesions involving small bowel and omentum. 3. Right oophorectomy.  SURGEON:  Mark E. Dareen Piano, M.D.  ANESTHESIA:  General endotracheal.  ESTIMATED BLOOD LOSS:  Minimal.  COMPLICATIONS:  None.  SPECIMENS:  Right ovary sent to pathology.  PREOPERATIVE MEDICATIONS: 1. Ancef 1 gm. 2. Heparin 5000 units subcu.  FINDINGS:  The patient had a loop of small bowel adherent to the anterior abdominal wall near the umbilicus.  There was also a large amount of omentum adherent to this same area.  In the pelvis, the patient had several loops of bowel adherent to the cul-de-sac and anterior abdominal wall in the midline. There was also a 3-cm cyst of the right ovary which was adherent to the right pelvic side wall.  The left ovary was adherent to the pelvic side wall and appeared to be normal.  The liver appeared to be normal.  There was no evidence of any endometriosis.  DESCRIPTION OF PROCEDURE:  The patient was taken to the operating room where she was placed in a dorsal supine position.  A general endotracheal anesthetic was administered without complications.  She was then placed in the dorsolithotomy position and prepped with Hibiclens.  A Foley catheter was placed in the bladder.  A vertical skin incision was made through the previous umbilical scar.  This was carried down the fascia.  The fascia was entered vertically, and two sutures of #0 Vicryl were placed laterally.  The parietal peritoneum was entered sharply.  The Hasson trocar was then placed  in the abdominal cavity.  The patient was placed in Trendelenburg, and three liters of carbon dioxide was insufflated.  The scope was placed, and as noted, there were extensive adhesions around the umbilicus.  These were all taken down with sharp dissection.  Next, a 5-mm port was placed in the suprapubic region and in the left lower quadrant.  The extensive adhesions involving the small bowel and omentum to the cul-de-sac and anterior abdominal wall were all taken down sharply.  Once this was accomplished, the right ovary was seen to be adherent to the pelvic side wall with a 3-cm cyst.  The adhesions holding the ovary to the pelvic side wall were all lysed, and the ovary was freed.  The infundibular pelvic ligament was then isolated, cauterized, and transected. Once the ovary was freed, it was removed through the umbilical port.  Copious irrigation was then performed, and hemostasis appeared to be adequate.  At this point, the left ovary was identified which was adherent to the side wall. It appeared to be normal.  After this, Intercede was placed in the right and left pelvic side walls in an attempt to decrease adhesion formation.  At this point, the procedure was concluded.  The instruments were removed.  The fascia was closed with interrupted #0 Vicryl sutures, and the skin was closed with 4-0 Vicryl suture in a subcuticular fashion.  The patient was taken to the recovery room in stable condition.  She will be discharged to home.  She will be continued on lovenox for three more days.  She will be sent home with Tylox to take p.r.n. DD:  08/07/99 TD:  08/09/99 Job: [id number]

## 2010-06-06 NOTE — Discharge Summary (Signed)
[facility]  Patient:    Laura Molina, Laura Molina Visit Number: [id number] MRN: [id number]          Service Type: MED Location: [id number] Attending Physician:  Mayra Neer Dictated by:   Lilla Shook, M.D. Admit Date:  05/08/2001 Discharge Date: 05/12/2001                             Discharge Summary  DATE OF BIRTH: [birth year].  DISCHARGE DIAGNOSES 1. Left lower extremity deep venous thrombosis. 2. Morbid obesity. 3. Hypothyroidism. 4. History of peptic ulcer disease. 5. Status post laparoscopic ovarian cyst removal.  HISTORY OF PRESENT ILLNESS: The patient is a 50 year old woman with a history of hypothyroidism, morbid obesity and left lower extremity DVT x 3 who was admitted with a chief complaint of a swollen tender left lower extremity. This had gotten progressively worse over the three days prior to admission. She denied any shortness of breath or chest pain, as well as no trauma to the area. She had no history of prolonged travel, nor a history of clotting disorder, personally nor in her family. 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  She has a history of three prior DVTs which were thought to be secondary to compression of an iliac vein by an ovarian cyst. There was an attempt at dissection of this ovary from the wall of her pelvis and from this vein during an ovarian cyst procedure, but secondary to the close adherence, it is likely that it was unable to be dissected. She has been on Coumadin for each of her DVTs in the past for up to six months. She has never had a pulmonary embolus.  PAST MEDICAL HISTORY 1. Morbid obesity. 2. Hypothyroidism. 3. History of peptic ulcer disease. 4. Status post laparoscopic ovarian cyst removal more than six months ago with attempt at dissection from the iliac vein.  MEDICATIONS: Synthroid.  ALLERGIES: IBUPROFEN CAUSES A RASH AND DARVOCET CAUSES  JITTERINESS.  SOCIAL HISTORY: She is married with four children. She is a Occupational psychologist. She does not drink nor smoke and uses no drugs.  FAMILY HISTORY: No history of clotting or bleeding disorders. No family history of miscarriages.  REVIEW OF SYSTEMS: Per the template, otherwise only remarkable for serious attempts at weight loss over the past two months, and currently using the Weight Watchers program.  SIGNIFICANT FINDINGS: Left lower extremity with deep venous thrombosis confirmed by duplex ultrasound, found in the superficial femoral vein and midway up the popliteal vein. No evidence of Bakers cyst.  LABORATORY DATA: INR 1.0. BUN 12, creatinine 0.9. WBC 9.9.  HOSPITAL COURSE: The patient was admitted with a diagnosis of an acute and chronic DVT and was started Lovenox, started heparin and was started on Coumadin a few days afterwards. Coagulation studies for factor V Leiden, protein C, protein S, were completed with all normal except for having a low protein S. It was not clear whether she had the protein S level drawn prior to anticoagulation. Her left extremity was elevated and she was released with oral Coumadin therapy as well as Lovenox injection as her INR was only 1.1 on discharge. She was on Lovenox 1.5 mg/kg per day.  She was to follow up as an outpatient with me.  DISCHARGE INSTRUCTIONS: She went home with Lovenox injections and received Lovenox teaching before leaving the hospital. She  was on Coumadin 5 mg q.d. before leaving and was to receive a check of her PT and INR the day following discharge and also on a regular basis thereafter. She was to continue elevating her leg and watching for symptoms such as shortness of breath, chest pain and tachycardia.  DISCHARGE MEDICATIONS: She was to resume her home medications which included Synthroid. Dictated by:   Lilla Shook, M.D. Attending Physician:  Mayra Neer DD:  06/16/01 TD:   06/18/01 Job: [id number]

## 2010-06-06 NOTE — H&P (Signed)
[name], Molina NO.:  [id number]   MEDICAL RECORD NO.:  [id number]                   PATIENT TYPE:  OBV   LOCATION:  5707                                 FACILITY:  [facility]   PHYSICIAN:  Theressa Millard, M.D.                 DATE OF BIRTH:  04-13-1960   DATE OF ADMISSION:  11/02/2001  DATE OF DISCHARGE:                                HISTORY & PHYSICAL   HISTORY OF PRESENT ILLNESS:  The patient is a 50 year old Hispanic female  admitted for observation for leg pain.  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  These occurred in 1989, 1999, 2000 and in April of 2003.  They  have all involved the left leg.  She was treated for six months with  Coumadin each time. She is still on Coumadin and she is checked regularly.  She has had some work-up in the past but the patient is unclear about the  results of this in terms of thrombophilia.  For the last two days, she has  had progressive pain and swelling in the left leg.  It seems to be bruised.  She reports a Doppler proven superficial thrombophlebitis in June of this  year while on Coumadin.   PAST MEDICAL HISTORY:  Hypothyroidism.   PAST SURGICAL HISTORY:  C-section x3.  Hysterectomy for fibroids.  Unilateral salpingo-oophorectomy.  Tennis elbow surgery x2.  Cholecystectomy.   ALLERGIES:  CODEINE, DARVOCET, IBUPROFEN.   MEDICATIONS:  1. Synthroid 0.15 mg daily.  2. Coumadin 5 alternating with 7.5 mg daily.   SOCIAL HISTORY:  The patient does not smoke or consume alcohol.  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   FAMILY HISTORY:  Negative for DVT.   REVIEW OF SYSTEMS:  No shortness of breath.  All other systems are negative.   PHYSICAL EXAMINATION:  GENERAL APPEARANCE:  A well-developed, well-  nourished, in no distress.  VITAL SIGNS:  Pulse 72.  LUNGS:  Clear to percussion and auscultation.  EXTREMITIES:  There is superficial tenderness along the medial left lower  leg and the left thigh.   Left calf is mildly enlarged compared to the right.   INR is 2.5.   IMPRESSION:  1. Leg pain.  Possible superficial thrombophlebitis, rule out deep venous     thrombosis while on Coumadin.  2. History of deep venous thrombosis.  3. Optimally anticoagulated.  4. Hypothyroidism.    PLAN:  The patient will be brought in for observation, given subcutaneous  Lovenox pain medications and an ultrasound Doppler will be obtained in the  morning.                                               Theressa Millard, M.D.  JO/MEDQ  D:  11/02/2001  T:  11/03/2001  Job:  [id number]

## 2010-06-06 NOTE — Consult Note (Signed)
[facility]  Patient:    Laura Molina, Laura Molina Visit Number: [id number] MRN: [id number]          Service Type: GON Location: GYN Attending Physician:  Jeannette Corpus Dictated by:   Rande Brunt. Clarke-Pearson, M.D. Proc. Date: 07/20/01 Admit Date:  07/20/2001   CC:         Loraine Leriche E. Dareen Piano, M.D.  Lilla Shook, M.D.  Telford Nab, R.N.   Consultation Report  NEW PATIENT CONSULTATION  REASON FOR CONSULTATION:  A 50 year old white female seen in consultation at the request of Loraine Leriche E. 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  HISTORY OF PRESENT ILLNESS:  The patient reports that she initially developed deep vein thrombosis in 1989 immediately following a cesarean section of her only child.  She apparently was treated with anticoagulants for six months and then was followed until 1999, when she developed recurrent deep vein thrombosis.  She has had subsequent recurrence in 2000 and most recently a superficial thrombophlebitis.  The patient is currently on Coumadin.  From a gynecologic point of view, the patient underwent a vaginal hysterectomy for dysmenorrhea in 2000.  Subsequently she underwent open laparoscopy with extensive lysis of adhesions and a right oophorectomy in July 2001 for treatment of right lower quadrant chronic pain.  She was found to have essentially no significant pathology in that ovary.  Subsequent to this the patient underwent further surgical procedure evaluating a left ovarian cyst and left lower quadrant pain, undergoing laparoscopic ovarian cystectomy February 17, 2001.  The cyst turned out to be a corpus luteum, and her ovary was densely adherent to the pelvic sidewall.  At the time of the patients most recent thrombophlebitis (July 08, 2001), an MRI was obtained which showed a multicystic lesion of the left ovary with a dominant cystic  component measuring 3 x 2.3 x 2.6 cm, compatible with a hemorrhagic cyst.  There were some venous varicosities associated with the ovary.  There was no comment in the MRI report as to any evidence of venous obstruction of the external iliac or common iliac vein.  The patient is seen today in consultation as to whether removing the other ovary might alleviate her deep vein thrombosis or prevent further problems.  The patients hospitalization notes from her recent hospitalization are reviewed.  PAST MEDICAL HISTORY: 1. Morbid obesity. 2. Left lower deep vein thrombosis. 3. Hypothyroidism. 4. History of peptic ulcer disease.  PAST SURGICAL HISTORY: 1. Laparoscopic ovarian cystectomy. 2. Vaginal hysterectomy. 3. Right salpingo-oophorectomy.  MEDICATIONS:  Synthroid and Coumadin.  DRUG ALLERGIES:  IBUPROFEN, DARVOCET.  SOCIAL HISTORY:  The patient is married.  She has four children.  She is a Occupational psychologist.  She does not smoke or drink.  FAMILY HISTORY:  Negative for clotting disorders, ovarian cancer, or other GYN cancers.  REVIEW OF SYSTEMS:  Pain in the left lower extremity and left lower quadrant, which is moderate to mild in nature.  She has no other GI or GU symptoms, has no vaginal bleeding.  PHYSICAL EXAMINATION:  VITAL SIGNS:  Height 5 feet, weight 227 pounds, blood pressure 118/80, pulse 80, respiratory rate 18.  GENERAL:  The patient is a very obese white female who is quite knowledgeable regarding her medical history and in no acute distress.  HEENT:  Negative.  NECK:  Supple without thyromegaly.  LYMPHATIC:  There is no supraclavicular or inguinal adenopathy.  ABDOMEN:  Obese, soft, nontender.  No  mass, organomegaly, ascites, or hernias are noted unless there is an umbilical hernia, which is difficult to outline on exam.  PELVIC:  EG, BUS normal.  Vagina is clean, well-supported.  Cervix and uterus surgically absent.  Adnexa without  any palpable masses.  Rectovaginal exam confirms.  IMPRESSION:  Recurrent deep vein thrombosis, initially occurring following cesarean section in 1989.  Currently the patient has imaging studies showing what probably is a hemorrhagic cyst in her residual left ovary.  I believe the question arises as to whether there is any extrinsic compression from this ovarian mass.  I think in general this would be highly unlikely given the size of the mass.  However, the patient indicates she is scheduled to have a venogram on July 7, and therefore I would suggest that special attention be made to evaluating the blood flow through the external iliac and common iliac vein on the left side to see whether there is any evidence of extrinsic compression.  If there was significant evidence of venous obstruction, then consideration of an open surgical procedure to perform a salpingo-oophorectomy would be entertained.  On the other hand, to remove this ovarian cyst (which I believe is benign and probably functional) without strong indication that we can help the patient, would then castrate her and remove her endogenous estrogens.  Certainly I think we would be loathe to begin her on hormone replacement therapy given the increased risk of deep vein thrombosis, and therefore I would suggest we try to preserve her left ovary if at all possible.  All this was outlined to the patient.  We will await the results of the venogram before making any further recommendations. Dictated by:   Rande Brunt. Clarke-Pearson, M.D. Attending Physician:  Jeannette Corpus DD:  07/20/01 TD:  07/21/01 Job: [id number]

## 2010-06-11 ENCOUNTER — Ambulatory Visit (INDEPENDENT_AMBULATORY_CARE_PROVIDER_SITE_OTHER): Payer: BC Managed Care – PPO

## 2010-06-11 ENCOUNTER — Inpatient Hospital Stay (INDEPENDENT_AMBULATORY_CARE_PROVIDER_SITE_OTHER)
Admission: RE | Admit: 2010-06-11 | Discharge: 2010-06-11 | Disposition: A | Discharge status: Home / Self Care | Payer: BC Managed Care – PPO | Source: Ambulatory Visit | Attending: Emergency Medicine | Admitting: Emergency Medicine

## 2010-06-11 ENCOUNTER — Emergency Department (HOSPITAL_COMMUNITY)
Admission: EM | Admit: 2010-06-11 | Discharge: 2010-06-11 | Disposition: A | Discharge status: Home / Self Care | Payer: BC Managed Care – PPO | Attending: Emergency Medicine | Admitting: Emergency Medicine

## 2010-06-11 DIAGNOSIS — R05 Cough: Secondary | ICD-10-CM | POA: Insufficient documentation

## 2010-06-11 DIAGNOSIS — R6883 Chills (without fever): Secondary | ICD-10-CM | POA: Insufficient documentation

## 2010-06-11 DIAGNOSIS — R630 Anorexia: Secondary | ICD-10-CM | POA: Insufficient documentation

## 2010-06-11 DIAGNOSIS — R1110 Vomiting, unspecified: Secondary | ICD-10-CM | POA: Insufficient documentation

## 2010-06-11 DIAGNOSIS — R0989 Other specified symptoms and signs involving the circulatory and respiratory systems: Secondary | ICD-10-CM

## 2010-06-11 DIAGNOSIS — R059 Cough, unspecified: Secondary | ICD-10-CM

## 2010-06-11 DIAGNOSIS — E039 Hypothyroidism, unspecified: Secondary | ICD-10-CM | POA: Insufficient documentation

## 2010-06-11 DIAGNOSIS — Z86718 Personal history of other venous thrombosis and embolism: Secondary | ICD-10-CM | POA: Insufficient documentation

## 2010-06-11 DIAGNOSIS — R0609 Other forms of dyspnea: Secondary | ICD-10-CM

## 2010-06-11 DIAGNOSIS — E7800 Pure hypercholesterolemia, unspecified: Secondary | ICD-10-CM | POA: Insufficient documentation

## 2010-06-11 DIAGNOSIS — I10 Essential (primary) hypertension: Secondary | ICD-10-CM | POA: Insufficient documentation

## 2010-06-11 DIAGNOSIS — Z79899 Other long term (current) drug therapy: Secondary | ICD-10-CM | POA: Insufficient documentation

## 2010-06-11 DIAGNOSIS — J189 Pneumonia, unspecified organism: Secondary | ICD-10-CM | POA: Insufficient documentation

## 2010-06-11 DIAGNOSIS — Z7901 Long term (current) use of anticoagulants: Secondary | ICD-10-CM | POA: Insufficient documentation

## 2010-06-11 LAB — POCT I-STAT, CHEM 8
BUN: 6 mg/dL (ref 6–23)
Calcium, Ion: 1.1 mmol/L — ABNORMAL LOW (ref 1.12–1.32)
Chloride: 105 mEq/L (ref 96–112)
Glucose, Bld: 91 mg/dL (ref 70–99)
Potassium: 3.4 mEq/L — ABNORMAL LOW (ref 3.5–5.1)
Sodium: 141 mEq/L (ref 135–145)
TCO2: 25 mmol/L (ref 0–100)

## 2010-06-11 LAB — PROTIME-INR: Prothrombin Time: 22.9 seconds — ABNORMAL HIGH (ref 11.6–15.2)

## 2010-06-11 LAB — CBC
MCH: 27.3 pg (ref 26.0–34.0)
MCHC: 34.4 g/dL (ref 30.0–36.0)
RBC: 5.02 MIL/uL (ref 3.87–5.11)
RDW: 13.3 % (ref 11.5–15.5)

## 2010-08-28 ENCOUNTER — Other Ambulatory Visit (HOSPITAL_COMMUNITY): Payer: Self-pay | Admitting: Internal Medicine

## 2010-08-28 DIAGNOSIS — Z1231 Encounter for screening mammogram for malignant neoplasm of breast: Secondary | ICD-10-CM

## 2010-09-02 ENCOUNTER — Ambulatory Visit (HOSPITAL_COMMUNITY): Payer: BC Managed Care – PPO

## 2010-09-10 ENCOUNTER — Ambulatory Visit (HOSPITAL_COMMUNITY)
Admission: RE | Admit: 2010-09-10 | Discharge: 2010-09-10 | Disposition: A | Discharge status: Home / Self Care | Payer: BC Managed Care – PPO | Source: Ambulatory Visit | Attending: Internal Medicine | Admitting: Internal Medicine

## 2010-09-10 DIAGNOSIS — Z1231 Encounter for screening mammogram for malignant neoplasm of breast: Secondary | ICD-10-CM | POA: Insufficient documentation

## 2010-10-28 LAB — URINALYSIS, ROUTINE W REFLEX MICROSCOPIC
Glucose, UA: NEGATIVE
Ketones, ur: NEGATIVE
Nitrite: NEGATIVE
Specific Gravity, Urine: 1.025
Urobilinogen, UA: 0.2
pH: 5.5

## 2010-10-28 LAB — URINE MICROSCOPIC-ADD ON

## 2010-10-29 LAB — POCT URINALYSIS DIP (DEVICE)
Bilirubin Urine: NEGATIVE
Glucose, UA: NEGATIVE
Hgb urine dipstick: NEGATIVE
Ketones, ur: NEGATIVE
Operator id: 148111
Specific Gravity, Urine: 1.015

## 2010-10-29 LAB — CBC
HCT: 29.5 — ABNORMAL LOW
HCT: 30.9 — ABNORMAL LOW
Hemoglobin: 10.1 — ABNORMAL LOW
Hemoglobin: 10.4 — ABNORMAL LOW
Hemoglobin: 11.6 — ABNORMAL LOW
MCHC: 33.8
MCHC: 33.9
MCV: 80
MCV: 81.2
Platelets: 258
RBC: 3.65 — ABNORMAL LOW
RBC: 3.8 — ABNORMAL LOW
RBC: 4.25
RDW: 14.7 — ABNORMAL HIGH
WBC: 5.9
WBC: 6
WBC: 8.5

## 2010-10-29 LAB — BASIC METABOLIC PANEL
CO2: 31
Calcium: 8.6
GFR calc Af Amer: >60
Sodium: 140

## 2010-10-29 LAB — PROTIME-INR
INR: 1.2
INR: 1.4
INR: 1.5

## 2010-10-29 LAB — COMPREHENSIVE METABOLIC PANEL
AST: 24
Albumin: 3.7
CO2: 29
Calcium: 8.9
Creatinine, Ser: 0.66
GFR calc Af Amer: >60
GFR calc non Af Amer: >60

## 2010-11-03 LAB — WET PREP, GENITAL
Trich, Wet Prep: NONE SEEN
Yeast Wet Prep HPF POC: NONE SEEN

## 2010-11-03 LAB — URINALYSIS, ROUTINE W REFLEX MICROSCOPIC
Glucose, UA: NEGATIVE
Hgb urine dipstick: NEGATIVE
Protein, ur: NEGATIVE
Specific Gravity, Urine: 1.025

## 2010-12-25 ENCOUNTER — Other Ambulatory Visit: Payer: Self-pay

## 2010-12-25 ENCOUNTER — Encounter: Payer: Self-pay | Admitting: *Deleted

## 2010-12-25 ENCOUNTER — Emergency Department (HOSPITAL_COMMUNITY)
Admission: EM | Admit: 2010-12-25 | Discharge: 2010-12-25 | Disposition: A | Discharge status: Home / Self Care | Payer: BC Managed Care – PPO | Attending: Emergency Medicine | Admitting: Emergency Medicine

## 2010-12-25 DIAGNOSIS — Z86718 Personal history of other venous thrombosis and embolism: Secondary | ICD-10-CM | POA: Insufficient documentation

## 2010-12-25 DIAGNOSIS — E876 Hypokalemia: Secondary | ICD-10-CM | POA: Insufficient documentation

## 2010-12-25 DIAGNOSIS — Z7901 Long term (current) use of anticoagulants: Secondary | ICD-10-CM | POA: Insufficient documentation

## 2010-12-25 DIAGNOSIS — J111 Influenza due to unidentified influenza virus with other respiratory manifestations: Secondary | ICD-10-CM | POA: Insufficient documentation

## 2010-12-25 HISTORY — DX: Acute embolism and thrombosis of unspecified deep veins of unspecified lower extremity: I82.409

## 2010-12-25 HISTORY — DX: Benign neoplasm of connective and other soft tissue, unspecified: D21.9

## 2010-12-25 LAB — CBC
HCT: 39.4 % (ref 36.0–46.0)
Hemoglobin: 13.3 g/dL (ref 12.0–15.0)
MCH: 27.4 pg (ref 26.0–34.0)
MCHC: 33.8 g/dL (ref 30.0–36.0)
MCV: 81.1 fL (ref 78.0–100.0)
Platelets: 181 10*3/uL (ref 150–400)
RBC: 4.86 MIL/uL (ref 3.87–5.11)
RDW: 13.5 % (ref 11.5–15.5)
WBC: 13.6 10*3/uL — ABNORMAL HIGH (ref 4.0–10.5)

## 2010-12-25 LAB — BASIC METABOLIC PANEL
BUN: 9 mg/dL (ref 6–23)
Chloride: 104 mEq/L (ref 96–112)
Creatinine, Ser: 0.59 mg/dL (ref 0.50–1.10)
GFR calc Af Amer: >90 mL/min (ref >90)
GFR calc non Af Amer: >90 mL/min (ref >90)

## 2010-12-25 LAB — ACETAMINOPHEN LEVEL: Acetaminophen (Tylenol), Serum: <15 ug/mL (ref 10–30)

## 2010-12-25 MED ORDER — POTASSIUM CHLORIDE 20 MEQ/15ML (10%) PO LIQD
40.0000 meq | Freq: Once | ORAL | Status: AC
  Administered 2010-12-25: 40 meq via ORAL
  Filled 2010-12-25: qty 30

## 2010-12-25 MED ORDER — ACETAMINOPHEN 325 MG PO TABS
650.0000 mg | ORAL_TABLET | Freq: Once | ORAL | Status: AC
  Administered 2010-12-25: 650 mg via ORAL
  Filled 2010-12-25: qty 2

## 2010-12-25 MED ORDER — POTASSIUM CHLORIDE CRYS ER 20 MEQ PO TBCR
40.0000 meq | EXTENDED_RELEASE_TABLET | Freq: Once | ORAL | Status: DC
  Filled 2010-12-25: qty 2

## 2010-12-25 NOTE — ED Provider Notes (Addendum)
History     CSN: [id number] Arrival date & time: 12/25/2010 12:56 PM   First MD Initiated Contact with Patient 12/25/10 1504      Chief Complaint  Patient presents with  . Dizziness  . Generalized Body Aches   patient began having symptoms 4 days ago. This included headache, sore throat, earache, sinus pain, body aches, weakness, and low-grade fevers. She did see a doctor that time ([facility] for "sinusitis" however, she states she is not feeling better. She also reported that her INR was elevated on Tuesday, and she has held her Coumadin since then. She did have this test repeated today. Dr. Idelle Crouch office, but he has not called her back yet. She's had no urinary symptoms, no rash, no neck pain, no back pain. She states feels weak all over. She denies any focal motor weakness. No slurred speech or blurred vision.  (Consider location/radiation/quality/duration/timing/severity/associated sxs/prior treatment) HPI  Past Medical History  Diagnosis Date  . Deep vein thrombosis (DVT)   . Fibroid     Past Surgical History  Procedure Date  . Abdominal surgery     lap band    No family history on file.  History  Substance Use Topics  . Smoking status: Not on file  . Smokeless tobacco: Not on file  . Alcohol Use: No    OB History    Grav Para Term Preterm Abortions TAB SAB Ect Mult Living                  Review of Systems  All other systems reviewed and are negative.    Allergies  Aspirin and Motrin  Home Medications   Current Outpatient Rx  Name Route Sig Dispense Refill  . ACETAMINOPHEN 160 MG/5ML PO LIQD Oral Take 320 mg by mouth every 4 (four) hours as needed. FOR PAIN     . AZITHROMYCIN 250 MG PO TABS Oral Take 250-500 mg by mouth daily. Take 2 tablets on day 1 then 1 tablet on days 2-5.     Marland Kitchen FLUTICASONE PROPIONATE 50 MCG/ACT NA SUSP Nasal Place 2 sprays into the nose daily as needed. For allergies      . LEVOTHYROXINE SODIUM 125 MCG PO TABS Oral Take 125 mcg by  mouth daily.      . WARFARIN SODIUM 5 MG PO TABS Oral Take 5 mg by mouth every evening.       BP 104/69  Pulse 70  Temp(Src) 98.8 F (37.1 C) (Oral)  Resp 16  SpO2 97%  Physical Exam  Nursing note and vitals reviewed. Constitutional: She is oriented to person, place, and time. She appears well-developed and well-nourished.  HENT:  Head: Normocephalic and atraumatic.  Mouth/Throat: Oropharynx is clear and moist.  Eyes: Conjunctivae and EOM are normal. Pupils are equal, round, and reactive to light.  Neck: Neck supple. No thyromegaly present.  Cardiovascular: Normal rate and regular rhythm.  Exam reveals no gallop and no friction rub.   No murmur heard. Pulmonary/Chest: Breath sounds normal. She has no wheezes. She has no rales. She exhibits no tenderness.  Abdominal: Soft. Bowel sounds are normal. She exhibits no distension. There is no tenderness. There is no rebound and no guarding.  Musculoskeletal: Normal range of motion.  Lymphadenopathy:    She has no cervical adenopathy.  Neurological: She is alert and oriented to person, place, and time. No cranial nerve deficit. Coordination normal.  Skin: Skin is warm and dry. No rash noted.  Psychiatric: She has a normal  mood and affect.    ED Course  Procedures (including critical care time)  Labs Reviewed  CBC - Abnormal; Notable for the following:    WBC 13.6 (*)    All other components within normal limits  BASIC METABOLIC PANEL - Abnormal; Notable for the following:    Potassium 3.2 (*)    All other components within normal limits  ACETAMINOPHEN LEVEL   No results found.   No diagnosis found.    MDM  Pt is seen and examined;  Initial history and physical completed.  Will follow.     Symptoms likely secondary to influenza based on current patterns. Patient will be given Tylenol. She is out of the window for Tamiflu to be effective. Will check basic labs and EKG. Otherwise, likely will be discharged home with  instructions.    Ayad Nieman A. Patrica Duel, MD 12/25/10 1513    Date: 12/25/2010  Rate: 82  Rhythm: normal sinus rhythm  QRS Axis: normal  Intervals: normal  ST/T Wave abnormalities: nonspecific T wave changes  Conduction Disutrbances:none  Narrative Interpretation:   Old EKG Reviewed: changes noted  Low voltage, No acute ischemic changes  Results for orders placed during the hospital encounter of 12/25/10  CBC      Component Value Range   WBC 13.6 (*) 4.0 - 10.5 (K/uL)   RBC 4.86  3.87 - 5.11 (MIL/uL)   Hemoglobin 13.3  12.0 - 15.0 (g/dL)   HCT 29.5  28.4 - 13.2 (%)   MCV 81.1  78.0 - 100.0 (fL)   MCH 27.4  26.0 - 34.0 (pg)   MCHC 33.8  30.0 - 36.0 (g/dL)   RDW 44.0  10.2 - 72.5 (%)   Platelets 181  150 - 400 (K/uL)  BASIC METABOLIC PANEL      Component Value Range   Sodium 140  135 - 145 (mEq/L)   Potassium 3.2 (*) 3.5 - 5.1 (mEq/L)   Chloride 104  96 - 112 (mEq/L)   CO2 30  19 - 32 (mEq/L)   Glucose, Bld 97  70 - 99 (mg/dL)   BUN 9  6 - 23 (mg/dL)   Creatinine, Ser 3.66  0.50 - 1.10 (mg/dL)   Calcium 9.3  8.4 - 44.0 (mg/dL)   GFR calc non Af Amer >90  >90 (mL/min)   GFR calc Af Amer >90  >90 (mL/min)  ACETAMINOPHEN LEVEL      Component Value Range   Acetaminophen (Tylenol), Serum <15.0  10 - 30 (ug/mL)   No results found.   Results for orders placed during the hospital encounter of 12/25/10  CBC      Component Value Range   WBC 13.6 (*) 4.0 - 10.5 (K/uL)   RBC 4.86  3.87 - 5.11 (MIL/uL)   Hemoglobin 13.3  12.0 - 15.0 (g/dL)   HCT 34.7  42.5 - 95.6 (%)   MCV 81.1  78.0 - 100.0 (fL)   MCH 27.4  26.0 - 34.0 (pg)   MCHC 33.8  30.0 - 36.0 (g/dL)   RDW 38.7  56.4 - 33.2 (%)   Platelets 181  150 - 400 (K/uL)  BASIC METABOLIC PANEL      Component Value Range   Sodium 140  135 - 145 (mEq/L)   Potassium 3.2 (*) 3.5 - 5.1 (mEq/L)   Chloride 104  96 - 112 (mEq/L)   CO2 30  19 - 32 (mEq/L)   Glucose, Bld 97  70 - 99 (mg/dL)   BUN 9  6 - 23 (mg/dL)   Creatinine, Ser  8.11  0.50 - 1.10 (mg/dL)   Calcium 9.3  8.4 - 91.4 (mg/dL)   GFR calc non Af Amer >90  >90 (mL/min)   GFR calc Af Amer >90  >90 (mL/min)  ACETAMINOPHEN LEVEL      Component Value Range   Acetaminophen (Tylenol), Serum <15.0  10 - 30 (ug/mL)   No results found.     British Moyd A. Patrica Duel, MD 12/29/10 7829

## 2010-12-25 NOTE — ED Notes (Signed)
No complaints at present. Voices understanding of instructions given. Walked to check out window.  

## 2010-12-25 NOTE — ED Notes (Signed)
Pt reports dizziness and weakness and body aches x 1 week.  Went to see her PMD Friday for same.  Reports received a call Tuesday that her INR is 7.5-pt is on coumadin for hx of DVT.  Pt reports dizziness and weakness and body aches is not getting better, reports going to the UC today and was instructed to come to the ED.

## 2010-12-25 NOTE — ED Notes (Signed)
States that she started feeling bad on Saturday/Sunday last week and started having body aches on Monday. States that she was seen at her MD office on Tuesday and dx with Sinus Infection was given a Z-pack in which she is on day 3. Denies fever but has had chills. Denies N/V/D.

## 2011-04-09 ENCOUNTER — Encounter (INDEPENDENT_AMBULATORY_CARE_PROVIDER_SITE_OTHER): Payer: Self-pay | Admitting: General Surgery

## 2011-04-09 ENCOUNTER — Ambulatory Visit (INDEPENDENT_AMBULATORY_CARE_PROVIDER_SITE_OTHER): Payer: Commercial Indemnity | Admitting: General Surgery

## 2011-04-09 VITALS — BP 122/86 | HR 60 | Temp 97.2°F | Resp 16 | Ht 61.0 in | Wt 123.5 lb

## 2011-04-09 DIAGNOSIS — Z9884 Bariatric surgery status: Secondary | ICD-10-CM

## 2011-04-09 NOTE — Patient Instructions (Signed)
Call as needed any time if there is continued weight loss or concern about weight regain.

## 2011-04-09 NOTE — Progress Notes (Signed)
Chief complaint: Followup LAP-BAND  History:The patient returns for followup of lap band placed March of 2008. I have not seen her in 2 years. She has been doing extraordinarily well with her weight loss. In fact she is here today because she is concerned about losing any more weight. She has been feeling well. She does have some nighttime reflux if she eats too late. Otherwise however she is able to tolerate solid food with good restriction and no vomiting or abdominal pain or other complaints. Past Medical History  Diagnosis Date  . Deep vein thrombosis (DVT)   . Fibroid   . Clotting disorder   . Thyroid disease    Past Surgical History  Procedure Date  . Abdominal surgery     lap band   Current Outpatient Prescriptions  Medication Sig Dispense Refill  . levothyroxine (SYNTHROID, LEVOTHROID) 125 MCG tablet Take 125 mcg by mouth daily.        Marland Kitchen warfarin (COUMADIN) 5 MG tablet Take 5 mg by mouth every evening.        Exam:  Gen.: Appears well Abdomen: Soft and nontender. Port sites were fine without infection.  Assessment and plan: Status post lap band with progressive weight loss to normal BMI. I don't see any evidence of complication that she certainly does not need to lose any more weight.  With this information I removed 1 cc without difficulty to put her at 7.5 cc. She understands if this does not allow her weight to stabilize or alternatively if she has weight regain she can call anytime. Otherwise I will see her in one year.

## 2011-05-28 ENCOUNTER — Telehealth (INDEPENDENT_AMBULATORY_CARE_PROVIDER_SITE_OTHER): Payer: Self-pay | Admitting: General Surgery

## 2011-06-01 NOTE — Telephone Encounter (Signed)
Lap band appointment made w/Andy on 07/02/11 @ 9:45, 1st available.

## 2011-07-02 ENCOUNTER — Ambulatory Visit (INDEPENDENT_AMBULATORY_CARE_PROVIDER_SITE_OTHER): Payer: Commercial Indemnity | Admitting: Physician Assistant

## 2011-07-02 ENCOUNTER — Encounter (INDEPENDENT_AMBULATORY_CARE_PROVIDER_SITE_OTHER): Payer: Self-pay

## 2011-07-02 VITALS — BP 110/68 | HR 64 | Temp 97.6°F | Resp 14 | Ht 61.0 in | Wt 143.2 lb

## 2011-07-02 DIAGNOSIS — Z4651 Encounter for fitting and adjustment of gastric lap band: Secondary | ICD-10-CM

## 2011-07-02 NOTE — Patient Instructions (Signed)
Take clear liquids tonight. Thin protein shakes are ok to start tomorrow morning. Slowly advance your diet thereafter. Call us if you have persistent vomiting or regurgitation, night cough or reflux symptoms. Return as scheduled or sooner if you notice no changes in hunger/portion sizes.  

## 2011-07-02 NOTE — Progress Notes (Signed)
  HISTORY: Laura Molina is a 51 y.o.female who received an AP-Standard lap-band in March 2008 by Dr. Johna Sheriff. She was last seen in March of this year when she had 1 mL fluid removed for excessive weight loss and reduced intake. She has gained about 20 lbs since then and this is associated with increased hunger and portion sizes. She denies persistent regurgitation or reflux.  VITAL SIGNS: Filed Vitals:   07/02/11 0949  BP: 110/68  Pulse: 64  Temp: 97.6 F (36.4 C)  Resp: 14    PHYSICAL EXAM: Physical exam reveals a very well-appearing 51 y.o.female in no apparent distress Neurologic: Awake, alert, oriented Psych: Bright affect, conversant Respiratory: Breathing even and unlabored. No stridor or wheezing Abdomen: Soft, nontender, nondistended to palpation. Incisions well-healed. No incisional hernias. Port easily palpated. Extremities: Atraumatic, good range of motion.  ASSESMENT: 51 y.o.  female  s/p AP-Standard lap-band.   PLAN: The patient's port was accessed with a 20G Huber needle without difficulty. Clear fluid was aspirated and 0.5 mL saline was added to the port to give a total predicted volume of 8 mL. The patient was able to swallow water without difficulty following the procedure and was instructed to take clear liquids for the next 24-48 hours and advance slowly as tolerated.

## 2011-08-20 ENCOUNTER — Encounter (INDEPENDENT_AMBULATORY_CARE_PROVIDER_SITE_OTHER): Payer: Self-pay

## 2011-08-20 ENCOUNTER — Ambulatory Visit (INDEPENDENT_AMBULATORY_CARE_PROVIDER_SITE_OTHER): Payer: Commercial Indemnity | Admitting: Physician Assistant

## 2011-08-20 VITALS — BP 118/80 | Wt 141.2 lb

## 2011-08-20 DIAGNOSIS — Z4651 Encounter for fitting and adjustment of gastric lap band: Secondary | ICD-10-CM

## 2011-08-20 NOTE — Patient Instructions (Signed)
Take clear liquids tonight. Thin protein shakes are ok to start tomorrow morning. Slowly advance your diet thereafter. Call us if you have persistent vomiting or regurgitation, night cough or reflux symptoms. Return as scheduled or sooner if you notice no changes in hunger/portion sizes.  

## 2011-08-20 NOTE — Progress Notes (Signed)
  HISTORY: Laura Molina is a 51 y.o.female who received an AP-Standard lap-band in March 2008 by Dr. Johna Sheriff. She's lost 5 lbs since her last visit but has persistent hunger and larger than desired portion sizes. She's jogging on a regular basis and is participating in an exercise program. No reflux or regurgitation.  VITAL SIGNS: Filed Vitals:   08/20/11 1153  BP: 118/80    PHYSICAL EXAM: Physical exam reveals a very well-appearing 51 y.o.female in no apparent distress Neurologic: Awake, alert, oriented Psych: Bright affect, conversant Respiratory: Breathing even and unlabored. No stridor or wheezing Abdomen: Soft, nontender, nondistended to palpation. Incisions well-healed. No incisional hernias. Port easily palpated. Extremities: Atraumatic, good range of motion.  ASSESMENT: 51 y.o.  female  s/p AP-Standard lap-band.   PLAN: The patient's port was accessed with a 20G Huber needle without difficulty. Clear fluid was aspirated and 0.25 mL saline was added to the port to give a total predicted volume of 8.25 mL. The patient was able to swallow water without difficulty following the procedure and was instructed to take clear liquids for the next 24-48 hours and advance slowly as tolerated.

## 2011-11-26 ENCOUNTER — Encounter (INDEPENDENT_AMBULATORY_CARE_PROVIDER_SITE_OTHER): Payer: Commercial Indemnity

## 2012-01-20 HISTORY — PX: OTHER SURGICAL HISTORY: SHX169

## 2012-09-05 ENCOUNTER — Other Ambulatory Visit: Payer: Self-pay

## 2012-09-05 DIAGNOSIS — Z1231 Encounter for screening mammogram for malignant neoplasm of breast: Secondary | ICD-10-CM

## 2012-09-26 ENCOUNTER — Ambulatory Visit: Payer: Self-pay

## 2012-11-09 ENCOUNTER — Other Ambulatory Visit: Payer: Self-pay | Admitting: Gastroenterology

## 2012-12-20 ENCOUNTER — Encounter (HOSPITAL_COMMUNITY): Payer: Self-pay | Admitting: Pharmacy Technician

## 2012-12-23 ENCOUNTER — Encounter (HOSPITAL_COMMUNITY): Payer: Self-pay | Admitting: *Deleted

## 2013-01-03 ENCOUNTER — Ambulatory Visit (HOSPITAL_COMMUNITY)
Admission: RE | Admit: 2013-01-03 | Discharge: 2013-01-03 | Disposition: A | Discharge status: Home / Self Care | Payer: Managed Care, Other (non HMO) | Source: Ambulatory Visit | Attending: Gastroenterology | Admitting: Gastroenterology

## 2013-01-03 ENCOUNTER — Encounter (HOSPITAL_COMMUNITY): Payer: Self-pay | Admitting: Anesthesiology

## 2013-01-03 ENCOUNTER — Encounter (HOSPITAL_COMMUNITY)
Admission: RE | Disposition: A | Discharge status: Home / Self Care | Payer: Self-pay | Source: Ambulatory Visit | Attending: Gastroenterology

## 2013-01-03 ENCOUNTER — Encounter (HOSPITAL_COMMUNITY): Payer: Managed Care, Other (non HMO) | Admitting: Anesthesiology

## 2013-01-03 ENCOUNTER — Ambulatory Visit (HOSPITAL_COMMUNITY): Payer: Managed Care, Other (non HMO) | Admitting: Anesthesiology

## 2013-01-03 DIAGNOSIS — E7800 Pure hypercholesterolemia, unspecified: Secondary | ICD-10-CM | POA: Insufficient documentation

## 2013-01-03 DIAGNOSIS — R894 Abnormal immunological findings in specimens from other organs, systems and tissues: Secondary | ICD-10-CM | POA: Insufficient documentation

## 2013-01-03 DIAGNOSIS — E039 Hypothyroidism, unspecified: Secondary | ICD-10-CM | POA: Insufficient documentation

## 2013-01-03 DIAGNOSIS — K219 Gastro-esophageal reflux disease without esophagitis: Secondary | ICD-10-CM | POA: Insufficient documentation

## 2013-01-03 DIAGNOSIS — Z1211 Encounter for screening for malignant neoplasm of colon: Secondary | ICD-10-CM | POA: Insufficient documentation

## 2013-01-03 DIAGNOSIS — Z7901 Long term (current) use of anticoagulants: Secondary | ICD-10-CM | POA: Insufficient documentation

## 2013-01-03 HISTORY — DX: Other specified postprocedural states: R11.2

## 2013-01-03 HISTORY — DX: Gastro-esophageal reflux disease without esophagitis: K21.9

## 2013-01-03 HISTORY — DX: Family history of other specified conditions: Z84.89

## 2013-01-03 HISTORY — PX: COLONOSCOPY WITH PROPOFOL: SHX5780

## 2013-01-03 HISTORY — DX: Other specified postprocedural states: Z98.890

## 2013-01-03 HISTORY — DX: Anesthesia of skin: R20.0

## 2013-01-03 SURGERY — COLONOSCOPY WITH PROPOFOL
Anesthesia: Monitor Anesthesia Care

## 2013-01-03 MED ORDER — FENTANYL CITRATE 0.05 MG/ML IJ SOLN
INTRAMUSCULAR | Status: AC
  Filled 2013-01-03: qty 5

## 2013-01-03 MED ORDER — KETAMINE HCL 50 MG/ML IJ SOLN
INTRAMUSCULAR | Status: AC
  Filled 2013-01-03: qty 10

## 2013-01-03 MED ORDER — MIDAZOLAM HCL 5 MG/5ML IJ SOLN
INTRAMUSCULAR | Status: DC | PRN
  Administered 2013-01-03: 2 mg via INTRAVENOUS

## 2013-01-03 MED ORDER — PROPOFOL 10 MG/ML IV BOLUS
INTRAVENOUS | Status: AC
  Filled 2013-01-03: qty 20

## 2013-01-03 MED ORDER — PROPOFOL INFUSION 10 MG/ML OPTIME
INTRAVENOUS | Status: DC | PRN
  Administered 2013-01-03: 75 ug/kg/min via INTRAVENOUS

## 2013-01-03 MED ORDER — ONDANSETRON HCL 4 MG/2ML IJ SOLN
INTRAMUSCULAR | Status: AC
  Filled 2013-01-03: qty 2

## 2013-01-03 MED ORDER — SODIUM CHLORIDE 0.9 % IV SOLN: INTRAVENOUS | Status: DC

## 2013-01-03 MED ORDER — KETAMINE HCL 10 MG/ML IJ SOLN
INTRAMUSCULAR | Status: DC | PRN
  Administered 2013-01-03: 25 mg via INTRAVENOUS

## 2013-01-03 MED ORDER — MIDAZOLAM HCL 2 MG/2ML IJ SOLN
INTRAMUSCULAR | Status: AC
  Filled 2013-01-03: qty 2

## 2013-01-03 MED ORDER — FENTANYL CITRATE 0.05 MG/ML IJ SOLN
INTRAMUSCULAR | Status: DC | PRN
  Administered 2013-01-03: 50 ug via INTRAVENOUS

## 2013-01-03 MED ORDER — LACTATED RINGERS IV SOLN
INTRAVENOUS | Status: DC
  Administered 2013-01-03: 09:00:00 via INTRAVENOUS
  Administered 2013-01-03: 1000 mL via INTRAVENOUS

## 2013-01-03 SURGICAL SUPPLY — 22 items

## 2013-01-03 NOTE — Anesthesia Postprocedure Evaluation (Signed)
  Anesthesia Post-op Note  Patient: Laura Molina  Procedure(s) Performed: Procedure(s) (LRB): COLONOSCOPY WITH PROPOFOL (N/A)  Patient Location: PACU  Anesthesia Type: MAC  Level of Consciousness: awake and alert   Airway and Oxygen Therapy: Patient Spontanous Breathing  Post-op Pain: mild  Post-op Assessment: Post-op Vital signs reviewed, Patient's Cardiovascular Status Stable, Respiratory Function Stable, Patent Airway and No signs of Nausea or vomiting  Last Vitals:  Filed Vitals:   01/03/13 0945  BP: 120/72  Temp: 36.4 C  Resp: 9    Post-op Vital Signs: stable   Complications: No apparent anesthesia complications

## 2013-01-03 NOTE — Transfer of Care (Signed)
Immediate Anesthesia Transfer of Care Note  Patient: Laura Molina  Procedure(s) Performed: Procedure(s): COLONOSCOPY WITH PROPOFOL (N/A)  Patient Location: PACU  Anesthesia Type:MAC  Level of Consciousness: awake, oriented and sedated  Airway & Oxygen Therapy: Patient Spontanous Breathing and Patient connected to nasal cannula oxygen  Post-op Assessment: Report given to PACU RN and Post -op Vital signs reviewed and stable  Post vital signs: stable  Complications: No apparent anesthesia complications

## 2013-01-03 NOTE — Anesthesia Preprocedure Evaluation (Addendum)
Anesthesia Evaluation  Patient identified by MRN, date of birth, ID band Patient awake    Reviewed: Allergy & Precautions, H&P , NPO status , Patient's Chart, lab work & pertinent test results  History of Anesthesia Complications (+) PONV, Family history of anesthesia reaction and history of anesthetic complications  Airway Mallampati: II TM Distance: >3 FB Neck ROM: Full    Dental no notable dental hx.    Pulmonary former smoker,  breath sounds clear to auscultation  Pulmonary exam normal       Cardiovascular negative cardio ROS  Rhythm:Regular Rate:Normal     Neuro/Psych negative neurological ROS  negative psych ROS   GI/Hepatic Neg liver ROS, GERD-  ,  Endo/Other  negative endocrine ROS  Renal/GU negative Renal ROS  negative genitourinary   Musculoskeletal negative musculoskeletal ROS (+)   Abdominal   Peds negative pediatric ROS (+)  Hematology Clotting disorder. H/O DVT.   Anesthesia Other Findings   Reproductive/Obstetrics negative OB ROS                          Anesthesia Physical Anesthesia Plan  ASA: II  Anesthesia Plan: MAC   Post-op Pain Management:    Induction: Intravenous  Airway Management Planned:   Additional Equipment:   Intra-op Plan:   Post-operative Plan:   Informed Consent: I have reviewed the patients History and Physical, chart, labs and discussed the procedure including the risks, benefits and alternatives for the proposed anesthesia with the patient or authorized representative who has indicated his/her understanding and acceptance.   Dental advisory given  Plan Discussed with: CRNA  Anesthesia Plan Comments:         Anesthesia Quick Evaluation

## 2013-01-03 NOTE — H&P (Signed)
  Procedure: Baseline screening colonoscopy  History: The patient is a 52 year old female born [birth year]. She is scheduled to undergo her first screening colonoscopy with polypectomy to prevent colon cancer.  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 She stop taking Coumadin 5 days ago. She is administering Lovenox injections.  Past medical history: Hysterectomy. Umbilical hernia repair. Oophorectomy. Cesarean section. Laparoscopic band procedure. Greenfield filter placement. Cholecystectomy. Lupus anticoagulant. Hypothyroidism. Hypercholesterolemia.  Medication allergies: Ibuprofen  Exam: The patient is alert and lying comfortably on the endoscopy stretcher. Abdomen is soft and nontender to palpation. Cardiac exam reveals a regular rhythm. Lungs are clear to auscultation.  Plan: Proceed with baseline screening colonoscopy.

## 2013-01-03 NOTE — Op Note (Signed)
Procedure: Baseline screening colonoscopy  Endoscopist: Danise Edge  Premedication: Propofol administered by anesthesia  Procedure: The patient was placed in the left lateral decubitus position. Anal inspection and digital rectal exam were normal. The Pentax pediatric colonoscope was introduced into the rectum and advanced to the cecum. A normal-appearing appendiceal orifice and ileocecal valve were identified. The ileocecal valve was intubated and the terminal ileum inspected. Colonic preparation for the exam today was good.  Rectum. Normal. Retroflexed view of the distal rectum normal.  Sigmoid colon and descending colon. Normal  Splenic flexure. Normal  Transverse colon. Normal  Hepatic flexure. Normal  Ascending colon. Normal  Cecum and ileocecal valve. Normal  Terminal ileum. Normal  Assessment: Normal screening proctocolonoscopy to the cecum  Recommendations: Schedule repeat screening colonoscopy in 10 years

## 2013-01-04 ENCOUNTER — Encounter (HOSPITAL_COMMUNITY): Payer: Self-pay | Admitting: Gastroenterology

## 2013-11-22 ENCOUNTER — Observation Stay (HOSPITAL_COMMUNITY): Payer: Managed Care, Other (non HMO)

## 2013-11-22 ENCOUNTER — Inpatient Hospital Stay (HOSPITAL_COMMUNITY)
Admission: EM | Admit: 2013-11-22 | Discharge: 2013-11-25 | DRG: 641 | Disposition: A | Discharge status: Home / Self Care | Payer: Managed Care, Other (non HMO) | Attending: Internal Medicine | Admitting: Internal Medicine

## 2013-11-22 ENCOUNTER — Encounter (HOSPITAL_COMMUNITY): Payer: Self-pay | Admitting: *Deleted

## 2013-11-22 ENCOUNTER — Emergency Department (HOSPITAL_COMMUNITY): Payer: Managed Care, Other (non HMO)

## 2013-11-22 DIAGNOSIS — E876 Hypokalemia: Secondary | ICD-10-CM | POA: Diagnosis not present

## 2013-11-22 DIAGNOSIS — E873 Alkalosis: Secondary | ICD-10-CM | POA: Diagnosis present

## 2013-11-22 DIAGNOSIS — F419 Anxiety disorder, unspecified: Secondary | ICD-10-CM | POA: Diagnosis present

## 2013-11-22 DIAGNOSIS — Z86718 Personal history of other venous thrombosis and embolism: Secondary | ICD-10-CM

## 2013-11-22 DIAGNOSIS — Z79899 Other long term (current) drug therapy: Secondary | ICD-10-CM

## 2013-11-22 DIAGNOSIS — R109 Unspecified abdominal pain: Secondary | ICD-10-CM

## 2013-11-22 DIAGNOSIS — E870 Hyperosmolality and hypernatremia: Secondary | ICD-10-CM | POA: Diagnosis present

## 2013-11-22 DIAGNOSIS — Z87891 Personal history of nicotine dependence: Secondary | ICD-10-CM

## 2013-11-22 DIAGNOSIS — Z7901 Long term (current) use of anticoagulants: Secondary | ICD-10-CM

## 2013-11-22 DIAGNOSIS — D638 Anemia in other chronic diseases classified elsewhere: Secondary | ICD-10-CM | POA: Diagnosis present

## 2013-11-22 DIAGNOSIS — M7989 Other specified soft tissue disorders: Secondary | ICD-10-CM

## 2013-11-22 DIAGNOSIS — E860 Dehydration: Secondary | ICD-10-CM | POA: Diagnosis present

## 2013-11-22 DIAGNOSIS — R140 Abdominal distension (gaseous): Secondary | ICD-10-CM | POA: Diagnosis not present

## 2013-11-22 DIAGNOSIS — K219 Gastro-esophageal reflux disease without esophagitis: Secondary | ICD-10-CM | POA: Diagnosis present

## 2013-11-22 DIAGNOSIS — E039 Hypothyroidism, unspecified: Secondary | ICD-10-CM

## 2013-11-22 DIAGNOSIS — D649 Anemia, unspecified: Secondary | ICD-10-CM | POA: Diagnosis present

## 2013-11-22 DIAGNOSIS — Z9884 Bariatric surgery status: Secondary | ICD-10-CM

## 2013-11-22 LAB — CBC
HEMATOCRIT: 34.8 % — AB (ref 36.0–46.0)
HEMOGLOBIN: 11.6 g/dL — AB (ref 12.0–15.0)
MCH: 27.2 pg (ref 26.0–34.0)
MCHC: 33.3 g/dL (ref 30.0–36.0)
MCV: 81.5 fL (ref 78.0–100.0)
Platelets: 261 10*3/uL (ref 150–400)
RBC: 4.27 MIL/uL (ref 3.87–5.11)
RDW: 14.6 % (ref 11.5–15.5)
WBC: 6.7 10*3/uL (ref 4.0–10.5)

## 2013-11-22 LAB — URINALYSIS, ROUTINE W REFLEX MICROSCOPIC
BILIRUBIN URINE: NEGATIVE
Glucose, UA: NEGATIVE mg/dL
HGB URINE DIPSTICK: NEGATIVE
Ketones, ur: NEGATIVE mg/dL
Nitrite: NEGATIVE
PH: 7 (ref 5.0–8.0)
Protein, ur: NEGATIVE mg/dL
Specific Gravity, Urine: 1.004 — ABNORMAL LOW (ref 1.005–1.030)
UROBILINOGEN UA: 0.2 mg/dL (ref 0.0–1.0)

## 2013-11-22 LAB — PROTIME-INR
INR: 2.23 — AB (ref 0.00–1.49)
Prothrombin Time: 24.9 seconds — ABNORMAL HIGH (ref 11.6–15.2)

## 2013-11-22 LAB — BASIC METABOLIC PANEL
Anion gap: 9 (ref 5–15)
BUN: 4 mg/dL — ABNORMAL LOW (ref 6–23)
CALCIUM: 8.5 mg/dL (ref 8.4–10.5)
CO2: 38 mEq/L — ABNORMAL HIGH (ref 19–32)
CREATININE: 0.47 mg/dL — AB (ref 0.50–1.10)
Chloride: 98 mEq/L (ref 96–112)
GLUCOSE: 99 mg/dL (ref 70–99)
Potassium: 2.2 mEq/L — CL (ref 3.7–5.3)
Sodium: 145 mEq/L (ref 137–147)

## 2013-11-22 LAB — HEPATIC FUNCTION PANEL
ALT: 24 U/L (ref 0–35)
AST: 42 U/L — ABNORMAL HIGH (ref 0–37)
Albumin: 3 g/dL — ABNORMAL LOW (ref 3.5–5.2)
Alkaline Phosphatase: 57 U/L (ref 39–117)
Total Bilirubin: 0.2 mg/dL — ABNORMAL LOW (ref 0.3–1.2)
Total Protein: 6.6 g/dL (ref 6.0–8.3)

## 2013-11-22 LAB — URINE MICROSCOPIC-ADD ON

## 2013-11-22 LAB — I-STAT TROPONIN, ED: TROPONIN I, POC: 0 ng/mL (ref 0.00–0.08)

## 2013-11-22 LAB — MAGNESIUM: Magnesium: 1.6 mg/dL (ref 1.5–2.5)

## 2013-11-22 LAB — PRO B NATRIURETIC PEPTIDE: Pro B Natriuretic peptide (BNP): 819.9 pg/mL — ABNORMAL HIGH (ref 0–125)

## 2013-11-22 MED ORDER — POTASSIUM CHLORIDE 10 MEQ/100ML IV SOLN
10.0000 meq | INTRAVENOUS | Status: AC
  Administered 2013-11-22 – 2013-11-23 (×4): 10 meq via INTRAVENOUS
  Filled 2013-11-22 (×4): qty 100

## 2013-11-22 MED ORDER — POTASSIUM CHLORIDE CRYS ER 20 MEQ PO TBCR
40.0000 meq | EXTENDED_RELEASE_TABLET | Freq: Once | ORAL | Status: AC
  Administered 2013-11-22: 40 meq via ORAL
  Filled 2013-11-22: qty 2

## 2013-11-22 MED ORDER — ACETAMINOPHEN 325 MG PO TABS: 650.0000 mg | ORAL_TABLET | Freq: Four times a day (QID) | ORAL | Status: DC | PRN

## 2013-11-22 MED ORDER — POTASSIUM CHLORIDE 10 MEQ/100ML IV SOLN: 10.0000 meq | Freq: Once | INTRAVENOUS | Status: DC

## 2013-11-22 MED ORDER — SODIUM CHLORIDE 0.9 % IV SOLN: INTRAVENOUS | Status: DC

## 2013-11-22 MED ORDER — SODIUM CHLORIDE 0.9 % IV SOLN
INTRAVENOUS | Status: DC
  Administered 2013-11-22: 22:00:00 via INTRAVENOUS

## 2013-11-22 MED ORDER — SODIUM CHLORIDE 0.9 % IJ SOLN
3.0000 mL | Freq: Two times a day (BID) | INTRAMUSCULAR | Status: DC
  Administered 2013-11-22 – 2013-11-24 (×2): 3 mL via INTRAVENOUS

## 2013-11-22 MED ORDER — WARFARIN SODIUM 4 MG PO TABS
4.0000 mg | ORAL_TABLET | Freq: Every day | ORAL | Status: DC
  Administered 2013-11-23 – 2013-11-24 (×3): 4 mg via ORAL
  Filled 2013-11-22 (×4): qty 1

## 2013-11-22 MED ORDER — SODIUM CHLORIDE 0.9 % IV SOLN
INTRAVENOUS | Status: DC
  Administered 2013-11-22: 20:00:00 via INTRAVENOUS

## 2013-11-22 MED ORDER — ONDANSETRON HCL 4 MG PO TABS: 4.0000 mg | ORAL_TABLET | Freq: Four times a day (QID) | ORAL | Status: DC | PRN

## 2013-11-22 MED ORDER — ONDANSETRON HCL 4 MG/2ML IJ SOLN: 4.0000 mg | Freq: Four times a day (QID) | INTRAMUSCULAR | Status: DC | PRN

## 2013-11-22 MED ORDER — WARFARIN - PHARMACIST DOSING INPATIENT: Freq: Every day | Status: DC

## 2013-11-22 MED ORDER — SODIUM CHLORIDE 0.9 % IJ SOLN: 3.0000 mL | Freq: Two times a day (BID) | INTRAMUSCULAR | Status: DC

## 2013-11-22 MED ORDER — ACETAMINOPHEN 650 MG RE SUPP: 650.0000 mg | Freq: Four times a day (QID) | RECTAL | Status: DC | PRN

## 2013-11-22 MED ORDER — LEVOTHYROXINE SODIUM 125 MCG PO TABS
125.0000 ug | ORAL_TABLET | Freq: Every day | ORAL | Status: DC
  Administered 2013-11-23 – 2013-11-25 (×3): 125 ug via ORAL
  Filled 2013-11-22 (×5): qty 1

## 2013-11-22 NOTE — ED Notes (Signed)
Reports difficulty urinating since Monday, only urinating small amounts at a time. Reports having recent adjustment to lapband due to weight loss. Reports since then having increase in fatigue, swelling to legs/feet and just not feeling well. Airway intact, no acute distress noted at triage.

## 2013-11-22 NOTE — H&P (Signed)
[facility] History and Physical  Ambreen Tufte LTJ:[id number] DOB: 06-21-1960 DOA: 11/22/2013  Referring physician: ER physician. PCP: Kandice Hams, MD   Chief Complaint: Bloating and edema.  HPI: Laura Molina is a 53 y.o. female with history of recurrent DVTon Coumadin, hypothyroidism, status post laparoscopic gastric banding presents to the ER because of feeling of fullness and bloating and edema. On exam patient does not have any gross edema and there pain also is complaining of urinary retention. Bladder scan did not show any much urinary retention. Patient's metabolic panel shows severe hypokalemia with EKG showing possible U waves. Patient states that she was having multiple episodes of nausea vomiting 2 weeks prior and she had gone to her surgeon last week and had her lab gastric banding addressed following which her nausea vomiting has improved. Patient still feels the abdomen bloated for which sonogram of the abdomen is pending. Patient at this time is admitted for severe hypokalemia.  Review of Systems: As presented in the history of presenting illness, rest negative.  Past Medical History  Diagnosis Date  . Deep vein thrombosis (DVT)     several over the years  . Fibroid   . 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 Thyroid disease   . GERD (gastroesophageal reflux disease)   . Numbness in both hands     tendonitis  . PONV (postoperative nausea and vomiting)   . Family history of anesthesia complication     family hx nausea and vomiting   Past Surgical History  Procedure Laterality Date  . Laparoscopic gastric banding  04/06/06  . Cesarean section      x 3  . Oophorectomy    . Tonsillectomy  as child  . Tendon release      bilateral  elbow  . Abdominal hysterectomy    . Cholecystectomy    . Ulnar nerve release Right jan 2014  . Colonoscopy with propofol N/A 01/03/2013    Procedure: COLONOSCOPY WITH PROPOFOL;  Surgeon: Garlan Fair, MD;  Location: [facility]  ENDOSCOPY;  Service: Endoscopy;  Laterality: N/A;   Social History:  reports that she quit smoking about 20 years ago. Her smoking use included Cigarettes. She has a 4 pack-year smoking history. She has never used smokeless tobacco. She reports that she does not drink alcohol or use illicit drugs. Where does patient live home. Can patient participate in ADLs? Yes.  Allergies  Allergen Reactions  . Aspirin     D/t coumadin  . Latex Rash  . Motrin [Ibuprofen] Rash    Family History:  Family History  Problem Relation Age of Onset  . Diabetes Mellitus II Mother   . Diabetes Mellitus II Father       Prior to Admission medications   Medication Sig Start Date End Date Taking? Authorizing Provider  levothyroxine (SYNTHROID, LEVOTHROID) 125 MCG tablet Take 125 mcg by mouth daily.    Yes Historical Provider, MD  warfarin (COUMADIN) 4 MG tablet Take 4 mg by mouth daily.   Yes Historical Provider, MD  warfarin (COUMADIN) 5 MG tablet Take 5 mg by mouth every evening. Take  1 tablet sun, mon, tue, wed and half a tablet thurs, fri, sat    Historical Provider, MD    Physical Exam: Filed Vitals:   11/22/13 1649 11/22/13 1900 11/22/13 1945 11/22/13 2000  BP: 126/58 110/64 105/66 108/92  Pulse: 75 80 63 55  Temp: 98.2 F (36.8 C)     TempSrc: Oral  Resp: 15 16 16 22   Height: 5' (1.524 m)     Weight: 54.432 kg (120 lb)     SpO2: 100% 99% 100% 99%     General:  Moderately built and nourished.  Eyes: anicteric no pallor.  ENT: no discharge from the ears eyes nose mouth.  Neck: no mass felt.  Cardiovascular: S1-S2 heard.  Respiratory: no rhonchi or crepitations.  Abdomen: soft nontender bowel sounds present.  Skin: no rash.  Musculoskeletal: no obvious edema.  Psychiatric: appears normal.  Neurologic: alert awake oriented to time place and person. Moves all extremities.  Labs on Admission:  Basic Metabolic Panel:  Recent Labs Lab 11/22/13 1705  NA 145  K 2.2*  CL  98  CO2 38*  GLUCOSE 99  BUN 4*  CREATININE 0.47*  CALCIUM 8.5  MG 1.6   Liver Function Tests:  Recent Labs Lab 11/22/13 1705  AST 42*  ALT 24  ALKPHOS 57  BILITOT 0.2*  PROT 6.6  ALBUMIN 3.0*   No results for input(s): LIPASE, AMYLASE in the last 168 hours. No results for input(s): AMMONIA in the last 168 hours. CBC:  Recent Labs Lab 11/22/13 1705  WBC 6.7  HGB 11.6*  HCT 34.8*  MCV 81.5  PLT 261   Cardiac Enzymes: No results for input(s): CKTOTAL, CKMB, CKMBINDEX, TROPONINI in the last 168 hours.  BNP (last 3 results) No results for input(s): PROBNP in the last 8760 hours. CBG: No results for input(s): GLUCAP in the last 168 hours.  Radiological Exams on Admission: Dg Chest 2 View  11/22/2013   CLINICAL DATA:  Bilateral leg swelling for 3 days, shortness of breath.  EXAM: CHEST  2 VIEW  COMPARISON:  06/11/2010.  FINDINGS: Trachea is midline. Heart size normal. Mild asymmetric prominence of the right hilum is unchanged. Lungs are clear. No pleural fluid. Mild degenerative changes are seen in the spine.  IMPRESSION: No acute findings.   Electronically Signed   By: Lorin Picket M.D.   On: 11/22/2013 17:58    EKG: Independently reviewed. Normal sinus rhythm with possible U waves.  Assessment/Plan Principal Problem:   Hypokalemia Active Problems:   History of DVT (deep vein thrombosis)   Hypothyroidism   Normocytic normochromic anemia   1. Severe hypokalemia - given the patient also has metabolic alkalosis we may have to consider syndromes like Gittelman syndrome for which I have ordered urine calcium and chloride. Aggressively replace potassium IV and orally and closely follow metabolic panel. Check magnesium levels. 2. History of recurrent DVT on Coumadin - Coumadin per pharmacy. 3. History of hypothyroidism - continue Synthroid. 4. History of laparoscopic gastric banding.    Code Status: full code.  Family Communication: family at the  bedside. Disposition Plan: admit for observation.    Jabri Blancett N. [facility] Pager [phone].  If 7PM-7AM, please contact night-coverage [email or web address] Password [email or web address] 11/22/2013, 8:58 PM

## 2013-11-22 NOTE — Progress Notes (Signed)
ANTICOAGULATION CONSULT NOTE - Initial Consult  Pharmacy Consult for Coumadin Indication: DVT  Allergies  Allergen Reactions  . Aspirin     D/t coumadin  . Latex Rash  . Motrin [Ibuprofen] Rash    Patient Measurements: Height: 5' (152.4 cm) Weight: 120 lb (54.432 kg) IBW/kg (Calculated) : 45.5   Vital Signs: Temp: 98.2 F (36.8 C) (11/04 1649) Temp Source: Oral (11/04 1649) BP: 98/59 mmHg (11/04 2100) Pulse Rate: 71 (11/04 2100)  Labs:  Recent Labs  11/22/13 1705 11/22/13 2035  HGB 11.6*  --   HCT 34.8*  --   PLT 261  --   LABPROT  --  24.9*  INR  --  2.23*  CREATININE 0.47*  --     Estimated Creatinine Clearance: 59.8 mL/min (by C-G formula based on Cr of 0.47).   Medical History: Past Medical History  Diagnosis Date  . Deep vein thrombosis (DVT)     several over the years  . Fibroid   . 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 Thyroid disease   . GERD (gastroesophageal reflux disease)   . Numbness in both hands     tendonitis  . PONV (postoperative nausea and vomiting)   . Family history of anesthesia complication     family hx nausea and vomiting     Assessment: 53 year old female on Coumadin PTA for history of DVT INR therapeutic on admission at home dose of 4 mg daily Last dose 11/3  Goal of Therapy:  INR 2-3 Monitor platelets by anticoagulation protocol: Yes   Plan:  Coumadin 4 mg po daily Daily INR  Thank you. Anette Guarneri, PharmD [phone]  11/22/2013,10:35 PM

## 2013-11-22 NOTE — ED Provider Notes (Signed)
CSN: [id number]     Arrival date & time 11/22/13  1646 History   First MD Initiated Contact with Patient 11/22/13 1826     Chief Complaint  Patient presents with  . Urinary Retention  . Leg Swelling     (Consider location/radiation/quality/duration/timing/severity/associated sxs/prior Treatment) The history is provided by the patient.  53 year old female presents with a one-week history of fatigue urinary retention and lower extremity swelling. Patient states that she believes she is retaining fluid her weight is gone up. Patient has a lap band been present for 8 years. It was adjusted last week because she was having multiple episodes of vomiting. That has now improved and the vomiting is stopped. No diarrhea. The patient still not feeling well. The difficulty with urination is been ongoing for the past 3 days. No fevers. Patient has a history of deep vein thrombosis and is on Coumadin. Patient denies shortness of breath or chest pain.  Past Medical History  Diagnosis Date  . Deep vein thrombosis (DVT)     several over the years  . Fibroid   . 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 Thyroid disease   . GERD (gastroesophageal reflux disease)   . Numbness in both hands     tendonitis  . PONV (postoperative nausea and vomiting)   . Family history of anesthesia complication     family hx nausea and vomiting   Past Surgical History  Procedure Laterality Date  . Laparoscopic gastric banding  04/06/06  . Cesarean section      x 3  . Oophorectomy    . Tonsillectomy  as child  . Tendon release      bilateral  elbow  . Abdominal hysterectomy    . Cholecystectomy    . Ulnar nerve release Right jan 2014  . Colonoscopy with propofol N/A 01/03/2013    Procedure: COLONOSCOPY WITH PROPOFOL;  Surgeon: Garlan Fair, MD;  Location: [facility] ENDOSCOPY;  Service: Endoscopy;  Laterality: N/A;   History reviewed. No pertinent family history. History  Substance Use Topics  . Smoking status:  Former Smoker -- 1.00 packs/day for 4 years    Types: Cigarettes    Quit date: 01/19/1993  . Smokeless tobacco: Never Used  . Alcohol Use: No   OB History    No data available     Review of Systems  Constitutional: Positive for fatigue.  HENT: Negative for congestion.   Eyes: Negative for visual disturbance.  Respiratory: Negative for shortness of breath.   Cardiovascular: Positive for leg swelling. Negative for chest pain.  Gastrointestinal: Positive for nausea, vomiting and abdominal distention. Negative for abdominal pain.  Genitourinary: Positive for decreased urine volume and difficulty urinating. Negative for dysuria.  Musculoskeletal: Negative for back pain.  Neurological: Negative for headaches.  Hematological: Bruises/bleeds easily.  Psychiatric/Behavioral: Negative for confusion.      Allergies  Aspirin; Latex; and Motrin  Home Medications   Prior to Admission medications   Medication Sig Start Date End Date Taking? Authorizing Provider  levothyroxine (SYNTHROID, LEVOTHROID) 125 MCG tablet Take 125 mcg by mouth daily.     Historical Provider, MD  warfarin (COUMADIN) 5 MG tablet Take 5 mg by mouth every evening. Take  1 tablet sun, mon, tue, wed and half a tablet thurs, fri, sat    Historical Provider, MD   BP 105/66 mmHg  Pulse 63  Temp(Src) 98.2 F (36.8 C) (Oral)  Resp 16  Ht 5' (1.524 m)  Wt 120  lb (54.432 kg)  BMI 23.44 kg/m2  SpO2 100% Physical Exam  Constitutional: She is oriented to person, place, and time. She appears well-developed and well-nourished. No distress.  HENT:  Head: Normocephalic and atraumatic.  Mouth/Throat: Oropharynx is clear and moist.  Eyes: Conjunctivae and EOM are normal. Pupils are equal, round, and reactive to light.  Neck: Normal range of motion.  Cardiovascular: Normal rate, regular rhythm and normal heart sounds.   No murmur heard. Pulmonary/Chest: Effort normal and breath sounds normal. No respiratory distress.   Abdominal: Soft. Bowel sounds are normal. She exhibits distension. There is no tenderness.  Musculoskeletal: Normal range of motion. She exhibits no edema.  Neurological: She is alert and oriented to person, place, and time. No cranial nerve deficit. She exhibits normal muscle tone. Coordination normal.  Skin: Skin is warm. No rash noted.  Nursing note and vitals reviewed.   ED Course  Procedures (including critical care time) Labs Review Labs Reviewed  CBC - Abnormal; Notable for the following:    Hemoglobin 11.6 (*)    HCT 34.8 (*)    All other components within normal limits  BASIC METABOLIC PANEL - Abnormal; Notable for the following:    Potassium 2.2 (*)    CO2 38 (*)    BUN 4 (*)    Creatinine, Ser 0.47 (*)    All other components within normal limits  HEPATIC FUNCTION PANEL - Abnormal; Notable for the following:    Albumin 3.0 (*)    AST 42 (*)    Total Bilirubin 0.2 (*)    All other components within normal limits  URINE CULTURE  PRO B NATRIURETIC PEPTIDE  URINALYSIS, ROUTINE W REFLEX MICROSCOPIC  I-STAT TROPOININ, ED   Results for orders placed or performed during the hospital encounter of 11/22/13  CBC  Result Value Ref Range   WBC 6.7 4.0 - 10.5 K/uL   RBC 4.27 3.87 - 5.11 MIL/uL   Hemoglobin 11.6 (L) 12.0 - 15.0 g/dL   HCT 34.8 (L) 36.0 - 46.0 %   MCV 81.5 78.0 - 100.0 fL   MCH 27.2 26.0 - 34.0 pg   MCHC 33.3 30.0 - 36.0 g/dL   RDW 14.6 11.5 - 15.5 %   Platelets 261 150 - 400 K/uL  Basic metabolic panel  Result Value Ref Range   Sodium 145 137 - 147 mEq/L   Potassium 2.2 (LL) 3.7 - 5.3 mEq/L   Chloride 98 96 - 112 mEq/L   CO2 38 (H) 19 - 32 mEq/L   Glucose, Bld 99 70 - 99 mg/dL   BUN 4 (L) 6 - 23 mg/dL   Creatinine, Ser 0.47 (L) 0.50 - 1.10 mg/dL   Calcium 8.5 8.4 - 10.5 mg/dL   GFR calc non Af Amer >90 >90 mL/min   GFR calc Af Amer >90 >90 mL/min   Anion gap 9 5 - 15  Hepatic function panel  Result Value Ref Range   Total Protein 6.6 6.0 - 8.3  g/dL   Albumin 3.0 (L) 3.5 - 5.2 g/dL   AST 42 (H) 0 - 37 U/L   ALT 24 0 - 35 U/L   Alkaline Phosphatase 57 39 - 117 U/L   Total Bilirubin 0.2 (L) 0.3 - 1.2 mg/dL   Bilirubin, Direct <0.2 0.0 - 0.3 mg/dL   Indirect Bilirubin NOT CALCULATED 0.3 - 0.9 mg/dL  I-stat troponin, ED (not at [facility])  Result Value Ref Range   Troponin i, poc 0.00 0.00 - 0.08 ng/mL  Comment 3             Imaging Review Dg Chest 2 View  11/22/2013   CLINICAL DATA:  Bilateral leg swelling for 3 days, shortness of breath.  EXAM: CHEST  2 VIEW  COMPARISON:  06/11/2010.  FINDINGS: Trachea is midline. Heart size normal. Mild asymmetric prominence of the right hilum is unchanged. Lungs are clear. No pleural fluid. Mild degenerative changes are seen in the spine.  IMPRESSION: No acute findings.   Electronically Signed   By: Lorin Picket M.D.   On: 11/22/2013 17:58     EKG Interpretation   Date/Time:  Wednesday November 22 2013 17:02:06 EST Ventricular Rate:  63 PR Interval:  126 QRS Duration: 94 QT Interval:  442 QTC Calculation: 452 R Axis:   81 Text Interpretation:  Normal sinus rhythm Normal ECG ? U wave Confirmed by  Kalmen Lollar  MD, [place] [id number]) on 11/22/2013 6:43:50 PM      MDM   Final diagnoses:  Hypokalemia    The patient complained of fatigue consistent with the hypokalemia. Patient also stating she is only urinating small amounts of urine. Abdomen does seem to be distended. This could be due to an enlarged bladder. We'll have them do a bladder scan. Patient is on Coumadin. INR pending. For the hypokalemia EKG without distinct evidence of U waves. We'll give 10 mEq of potassium IV 2 was already given 40 mg potassium by mouth. Patient will require admission for the low potassium. In addition patient's albumin is low this could be the reason for the fluid retention she talks about. Renal function is normal though.    Fredia Sorrow, MD 11/22/13 2019

## 2013-11-22 NOTE — Progress Notes (Signed)
Report received from Tye Savoy, RN.

## 2013-11-22 NOTE — ED Notes (Addendum)
Pt reports urinary retention and lower extremity swelling x1 week. Pt reports last urination around 1600 of low volume.  Pt denies any blood in urine but does report pressure. Pt reports a shooting pain in bilateral flank area.

## 2013-11-22 NOTE — ED Notes (Signed)
Ultrasound called ready for transport

## 2013-11-23 DIAGNOSIS — D649 Anemia, unspecified: Secondary | ICD-10-CM

## 2013-11-23 DIAGNOSIS — Z87891 Personal history of nicotine dependence: Secondary | ICD-10-CM | POA: Diagnosis not present

## 2013-11-23 DIAGNOSIS — E039 Hypothyroidism, unspecified: Secondary | ICD-10-CM | POA: Diagnosis present

## 2013-11-23 DIAGNOSIS — E876 Hypokalemia: Secondary | ICD-10-CM | POA: Diagnosis present

## 2013-11-23 DIAGNOSIS — K219 Gastro-esophageal reflux disease without esophagitis: Secondary | ICD-10-CM | POA: Diagnosis present

## 2013-11-23 DIAGNOSIS — E873 Alkalosis: Secondary | ICD-10-CM | POA: Diagnosis present

## 2013-11-23 DIAGNOSIS — R140 Abdominal distension (gaseous): Secondary | ICD-10-CM | POA: Diagnosis present

## 2013-11-23 DIAGNOSIS — F419 Anxiety disorder, unspecified: Secondary | ICD-10-CM | POA: Diagnosis present

## 2013-11-23 DIAGNOSIS — D638 Anemia in other chronic diseases classified elsewhere: Secondary | ICD-10-CM | POA: Diagnosis present

## 2013-11-23 DIAGNOSIS — E860 Dehydration: Secondary | ICD-10-CM | POA: Diagnosis present

## 2013-11-23 DIAGNOSIS — E870 Hyperosmolality and hypernatremia: Secondary | ICD-10-CM

## 2013-11-23 DIAGNOSIS — Z9884 Bariatric surgery status: Secondary | ICD-10-CM | POA: Diagnosis not present

## 2013-11-23 DIAGNOSIS — Z79899 Other long term (current) drug therapy: Secondary | ICD-10-CM | POA: Diagnosis not present

## 2013-11-23 DIAGNOSIS — Z86718 Personal history of other venous thrombosis and embolism: Secondary | ICD-10-CM | POA: Diagnosis not present

## 2013-11-23 DIAGNOSIS — Z7901 Long term (current) use of anticoagulants: Secondary | ICD-10-CM | POA: Diagnosis not present

## 2013-11-23 LAB — COMPREHENSIVE METABOLIC PANEL
ALBUMIN: 2.2 g/dL — AB (ref 3.5–5.2)
ALT: 17 U/L (ref 0–35)
AST: 28 U/L (ref 0–37)
Alkaline Phosphatase: 43 U/L (ref 39–117)
Anion gap: 11 (ref 5–15)
BUN: 3 mg/dL — ABNORMAL LOW (ref 6–23)
CHLORIDE: 105 meq/L (ref 96–112)
CO2: 33 mEq/L — ABNORMAL HIGH (ref 19–32)
CREATININE: 0.47 mg/dL — AB (ref 0.50–1.10)
Calcium: 7.7 mg/dL — ABNORMAL LOW (ref 8.4–10.5)
GFR calc Af Amer: >90 mL/min (ref >90)
Glucose, Bld: 93 mg/dL (ref 70–99)
Potassium: 2.6 mEq/L — CL (ref 3.7–5.3)
Sodium: 149 mEq/L — ABNORMAL HIGH (ref 137–147)
Total Protein: 5 g/dL — ABNORMAL LOW (ref 6.0–8.3)

## 2013-11-23 LAB — CHLORIDE, URINE, RANDOM: Chloride Urine: 101 mEq/L

## 2013-11-23 LAB — CBC WITH DIFFERENTIAL/PLATELET
Basophils Absolute: 0 10*3/uL (ref 0.0–0.1)
Basophils Relative: 0 % (ref 0–1)
EOS ABS: 0.2 10*3/uL (ref 0.0–0.7)
EOS PCT: 3 % (ref 0–5)
HEMATOCRIT: 30.2 % — AB (ref 36.0–46.0)
Hemoglobin: 10.1 g/dL — ABNORMAL LOW (ref 12.0–15.0)
Lymphocytes Relative: 30 % (ref 12–46)
Lymphs Abs: 1.7 10*3/uL (ref 0.7–4.0)
MCH: 27.5 pg (ref 26.0–34.0)
MCHC: 33.4 g/dL (ref 30.0–36.0)
MCV: 82.3 fL (ref 78.0–100.0)
Monocytes Absolute: 0.6 10*3/uL (ref 0.1–1.0)
Monocytes Relative: 10 % (ref 3–12)
Neutro Abs: 3.2 10*3/uL (ref 1.7–7.7)
Neutrophils Relative %: 57 % (ref 43–77)
PLATELETS: 223 10*3/uL (ref 150–400)
RBC: 3.67 MIL/uL — AB (ref 3.87–5.11)
RDW: 14.8 % (ref 11.5–15.5)
WBC: 5.6 10*3/uL (ref 4.0–10.5)

## 2013-11-23 LAB — URINE CULTURE
COLONY COUNT: NO GROWTH
CULTURE: NO GROWTH

## 2013-11-23 LAB — BASIC METABOLIC PANEL
Anion gap: 8 (ref 5–15)
BUN: 4 mg/dL — AB (ref 6–23)
CO2: 33 mEq/L — ABNORMAL HIGH (ref 19–32)
Calcium: 7.9 mg/dL — ABNORMAL LOW (ref 8.4–10.5)
Chloride: 105 mEq/L (ref 96–112)
Creatinine, Ser: 0.5 mg/dL (ref 0.50–1.10)
GFR calc non Af Amer: >90 mL/min (ref >90)
Glucose, Bld: 98 mg/dL (ref 70–99)
POTASSIUM: 3.5 meq/L — AB (ref 3.7–5.3)
SODIUM: 146 meq/L (ref 137–147)

## 2013-11-23 LAB — PROTIME-INR
INR: 2.48 — ABNORMAL HIGH (ref 0.00–1.49)
Prothrombin Time: 27 seconds — ABNORMAL HIGH (ref 11.6–15.2)

## 2013-11-23 MED ORDER — DEXTROSE 5 % IV SOLN
INTRAVENOUS | Status: AC
  Administered 2013-11-23: 125 mL via INTRAVENOUS
  Administered 2013-11-24: 03:00:00 via INTRAVENOUS

## 2013-11-23 MED ORDER — COUMADIN BOOK
Freq: Once | Status: DC
  Filled 2013-11-23: qty 1

## 2013-11-23 MED ORDER — POTASSIUM CHLORIDE 10 MEQ/100ML IV SOLN
10.0000 meq | INTRAVENOUS | Status: AC
  Administered 2013-11-23 (×2): 10 meq via INTRAVENOUS
  Filled 2013-11-23 (×5): qty 100

## 2013-11-23 MED ORDER — POTASSIUM CHLORIDE 10 MEQ/100ML IV SOLN
10.0000 meq | INTRAVENOUS | Status: AC
  Administered 2013-11-23: 10 meq via INTRAVENOUS
  Filled 2013-11-23 (×3): qty 100

## 2013-11-23 MED ORDER — POTASSIUM CHLORIDE CRYS ER 20 MEQ PO TBCR
60.0000 meq | EXTENDED_RELEASE_TABLET | Freq: Once | ORAL | Status: AC
  Administered 2013-11-23: 60 meq via ORAL
  Filled 2013-11-23: qty 3

## 2013-11-23 NOTE — Progress Notes (Signed)
ANTICOAGULATION CONSULT NOTE - Follow Up Consult  Pharmacy Consult for warfarin Indication:  hx DVT  Allergies  Allergen Reactions  . Aspirin     D/t coumadin  . Latex Rash  . Motrin [Ibuprofen] Rash    Patient Measurements: Height: 5' (152.4 cm) Weight: 128 lb 12 oz (58.4 kg) IBW/kg (Calculated) : 45.5  Vital Signs: Temp: 98.4 F (36.9 C) (11/05 0528) Temp Source: Oral (11/05 0528) BP: 90/62 mmHg (11/05 0540) Pulse Rate: 64 (11/05 0528)  Labs:  Recent Labs  11/22/13 1705 11/22/13 2035 11/23/13 0421  HGB 11.6*  --  10.1*  HCT 34.8*  --  30.2*  PLT 261  --  223  LABPROT  --  24.9* 27.0*  INR  --  2.23* 2.48*  CREATININE 0.47*  --  0.47*    Estimated Creatinine Clearance: 66.6 mL/min (by C-G formula based on Cr of 0.47).  Assessment: 53 y/o female on chronic warfarin for hx DVT. INR is therapeutic at 2.48. No bleeding noted, CBC is stable.  Goal of Therapy:  INR 2-3 Monitor platelets by anticoagulation protocol: Yes   Plan:  - Warfarin 4 mg PO q1800 - INR daily - Monitor for s/sx of bleeding  [facility], Pharm.D., BCPS Clinical Pharmacist Pager: [phone] 11/23/2013 8:59 AM

## 2013-11-23 NOTE — Plan of Care (Signed)
Problem: Phase I Progression Outcomes Goal: Anginal pain relieved Outcome: Completed/Met Date Met:  11/23/13     

## 2013-11-23 NOTE — Care Management Note (Unsigned)
    Page 1 of 1   11/23/2013     4:25:01 PM CARE MANAGEMENT NOTE 11/23/2013  Patient:  Laura Molina, Laura Molina   Account Number:  [id number]  Date Initiated:  11/23/2013  Documentation initiated by:  Ivelise Castillo  Subjective/Objective Assessment:   Pt adm on 11/22/13 with hypokalemia, hypernatremia.  PTA, pt independent of ADLS, lives with spouse.     Action/Plan:   Will follow for dc needs as pt progresses.   Anticipated DC Date:  11/24/2013   Anticipated DC Plan:  [place]  CM consult      Choice offered to / List presented to:             Status of service:  In process, will continue to follow Medicare Important Message given?   (If response is "NO", the following Medicare IM given date fields will be blank) Date Medicare IM given:   Medicare IM given by:   Date Additional Medicare IM given:   Additional Medicare IM given by:    Discharge Disposition:    Per UR Regulation:  Reviewed for med. necessity/level of care/duration of stay  If discussed at [place] of Stay Meetings, dates discussed:    Comments:

## 2013-11-23 NOTE — Progress Notes (Signed)
UR completed 

## 2013-11-23 NOTE — Plan of Care (Signed)
Problem: Consults Goal: Chest Pain Patient Education (See Patient Education module for education specifics.) Outcome: Progressing

## 2013-11-23 NOTE — Plan of Care (Signed)
Problem: Phase I Progression Outcomes Goal: Hemodynamically stable Outcome: Completed/Met Date Met:  11/23/13 Goal: Anginal pain relieved Outcome: Completed/Met Date Met:  11/23/13 Goal: Aspirin unless contraindicated Outcome: Completed/Met Date Met:  11/23/13 Goal: MD aware of Cardiac Marker results Outcome: Completed/Met Date Met:  11/23/13 Goal: Voiding-avoid urinary catheter unless indicated Outcome: Completed/Met Date Met:  11/23/13

## 2013-11-23 NOTE — Discharge Instructions (Addendum)
Information on my medicine - Coumadin   (Warfarin)  This medication education was reviewed with me or my healthcare representative as part of my discharge preparation.  The pharmacist that spoke with me during my hospital stay was:  [facility], Margot Chimes, [facility]  Why was Coumadin prescribed for you? Coumadin was prescribed for you because you have a blood clot or a medical condition that can cause an increased risk of forming blood clots. Blood clots can cause serious health problems by blocking the flow of blood to the heart, lung, or brain. Coumadin can prevent harmful blood clots from forming. As a reminder your indication for Coumadin is:   Select from menu history of deep vein thrombosis  What test will check on my response to Coumadin? While on Coumadin (warfarin) you will need to have an INR test regularly to ensure that your dose is keeping you in the desired range. The INR (international normalized ratio) number is calculated from the result of the laboratory test called prothrombin time (PT).  If an INR APPOINTMENT HAS NOT ALREADY BEEN MADE FOR YOU please schedule an appointment to have this lab work done by your health care provider within 7 days. Your INR goal is usually a number between:  2 to 3 or your provider may give you a more narrow range like 2-2.5.  Ask your health care provider during an office visit what your goal INR is.  What  do you need to  know  About  COUMADIN? Take Coumadin (warfarin) exactly as prescribed by your healthcare provider about the same time each day.  DO NOT stop taking without talking to the doctor who prescribed the medication.  Stopping without other blood clot prevention medication to take the place of Coumadin may increase your risk of developing a new clot or stroke.  Get refills before you run out.  What do you do if you miss a dose? If you miss a dose, take it as soon as you remember on the same day then continue your regularly scheduled regimen  the next day.  Do not take two doses of Coumadin at the same time.  Important Safety Information A possible side effect of Coumadin (Warfarin) is an increased risk of bleeding. You should call your healthcare provider right away if you experience any of the following: ? Bleeding from an injury or your nose that does not stop. ? Unusual colored urine (red or dark brown) or unusual colored stools (red or black). ? Unusual bruising for unknown reasons. ? A serious fall or if you hit your head (even if there is no bleeding).  Some foods or medicines interact with Coumadin (warfarin) and might alter your response to warfarin. To help avoid this: ? Eat a balanced diet, maintaining a consistent amount of Vitamin K. ? Notify your provider about major diet changes you plan to make. ? Avoid alcohol or limit your intake to 1 drink for women and 2 drinks for men per day. (1 drink is 5 oz. wine, 12 oz. beer, or 1.5 oz. liquor.)  Make sure that ANY health care provider who prescribes medication for you knows that you are taking Coumadin (warfarin).  Also make sure the healthcare provider who is monitoring your Coumadin knows when you have started a new medication including herbals and non-prescription products.  Coumadin (Warfarin)  Major Drug Interactions  Increased Warfarin Effect Decreased Warfarin Effect  Alcohol (large quantities) Antibiotics (esp. Septra/Bactrim, Flagyl, Cipro) Amiodarone (Cordarone) Aspirin (ASA) Cimetidine (Tagamet) Megestrol (Megace)  NSAIDs (ibuprofen, naproxen, etc.) Piroxicam (Feldene) Propafenone (Rythmol SR) Propranolol (Inderal) Isoniazid (INH) Posaconazole (Noxafil) Barbiturates (Phenobarbital) Carbamazepine (Tegretol) Chlordiazepoxide (Librium) Cholestyramine (Questran) Griseofulvin Oral Contraceptives Rifampin Sucralfate (Carafate) Vitamin K   Coumadin (Warfarin) Major Herbal Interactions  Increased Warfarin Effect Decreased Warfarin Effect   Garlic Ginseng Ginkgo biloba Coenzyme Q10 Green tea St. Johns wort    Coumadin (Warfarin) FOOD Interactions  Eat a consistent number of servings per week of foods HIGH in Vitamin K (1 serving =  cup)  Collards (cooked, or boiled & drained) Kale (cooked, or boiled & drained) Mustard greens (cooked, or boiled & drained) Parsley *serving size only =  cup Spinach (cooked, or boiled & drained) Swiss chard (cooked, or boiled & drained) Turnip greens (cooked, or boiled & drained)  Eat a consistent number of servings per week of foods MEDIUM-HIGH in Vitamin K (1 serving = 1 cup)  Asparagus (cooked, or boiled & drained) Broccoli (cooked, boiled & drained, or raw & chopped) Brussel sprouts (cooked, or boiled & drained) *serving size only =  cup Lettuce, raw (green leaf, endive, romaine) Spinach, raw Turnip greens, raw & chopped   These websites have more information on Coumadin (warfarin):  FailFactory.se; VeganReport.com.au;  Hypokalemia Hypokalemia means that the amount of potassium in the blood is lower than normal.Potassium is a chemical, called an electrolyte, that helps regulate the amount of fluid in the body. It also stimulates muscle contraction and helps nerves function properly.Most of the body's potassium is inside of cells, and only a very small amount is in the blood. Because the amount in the blood is so small, minor changes can be life-threatening. CAUSES  Antibiotics.  Diarrhea or vomiting.  Using laxatives too much, which can cause diarrhea.  Chronic kidney disease.  Water pills (diuretics).  Eating disorders (bulimia).  Low magnesium level.  Sweating a lot. SIGNS AND SYMPTOMS  Weakness.  Constipation.  Fatigue.  Muscle cramps.  Mental confusion.  Skipped heartbeats or irregular heartbeat (palpitations).  Tingling or numbness. DIAGNOSIS  Your health care provider can diagnose hypokalemia with blood tests. In addition to  checking your potassium level, your health care provider may also check other lab tests. TREATMENT Hypokalemia can be treated with potassium supplements taken by mouth or adjustments in your current medicines. If your potassium level is very low, you may need to get potassium through a vein (IV) and be monitored in the hospital. A diet high in potassium is also helpful. Foods high in potassium are:  Nuts, such as peanuts and pistachios.  Seeds, such as sunflower seeds and pumpkin seeds.  Peas, lentils, and lima beans.  Whole grain and bran cereals and breads.  Fresh fruit and vegetables, such as apricots, avocado, bananas, cantaloupe, kiwi, oranges, tomatoes, asparagus, and potatoes.  Orange and tomato juices.  Red meats.  Fruit yogurt. HOME CARE INSTRUCTIONS  Take all medicines as prescribed by your health care provider.  Maintain a healthy diet by including nutritious food, such as fruits, vegetables, nuts, whole grains, and lean meats.  If you are taking a laxative, be sure to follow the directions on the label. SEEK MEDICAL CARE IF:  Your weakness gets worse.  You feel your heart pounding or racing.  You are vomiting or having diarrhea.  You are diabetic and having trouble keeping your blood glucose in the normal range. SEEK IMMEDIATE MEDICAL CARE IF:  You have chest pain, shortness of breath, or dizziness.  You are vomiting or having diarrhea for more than 2 days.  You faint. MAKE SURE YOU:   Understand these instructions.  Will watch your condition.  Will get help right away if you are not doing well or get worse. Document Released: 01/05/2005 Document Revised: 10/26/2012 Document Reviewed: 07/08/2012 [facility] Patient Information 2015 [place], [place]. This information is not intended to replace advice given to you by your health care provider. Make sure you discuss any questions you have with your health care provider.

## 2013-11-23 NOTE — Progress Notes (Signed)
PROGRESS NOTE    Laura Molina OXB:[id number] DOB: 31-Dec-1960 DOA: 11/22/2013 PCP: Kandice Hams, MD  General Surgery: Dr. Excell Seltzer  HPI/Brief narrative 53 year old female patient with history of recurrent DVT on Coumadin, hypothyroidism, status post laparoscopic gastric banding 8 years ago, presented to the ED with complaints of generalized bloating/fullness and was found to have severe hypokalemia. She states that she was in her usual state of health until a month ago when her grandson demised following which she was down and had nausea and vomiting 2 weeks associated with poor oral intake. She was seen by Dr. Excell Seltzer a week ago and her gastric banding was loosened following which she was able to tolerate for better without any further nausea or vomiting. She however noticed bloating sensation and states that she gained 12 pounds in 1 week. She was seen by her PCP recently. She presented to the ED yesterday due to continued bloating sensation and "funny feeling in her chest". She denies specific chest pain. Complains of some dyspnea related to anxiety.   Assessment/Plan:  1. Severe hypokalemia:likely secondary to poor oral intake and GI losses. Replace aggressively and follow BMP closely. Continue monitoring on telemetry. 2. Hypernatremic dehydration: Secondary to poor oral intake and GI losses. Hydrate with IV D5 W and monitor BMP closely. By mouth diet as tolerated. 3. Anemia: Likely of chronic disease. No reported bleeding. Follow CBCs. 4. History of recurrent DVT on Coumadin: INR therapeutic at 2.48-management per pharmacy. 5. History of hypothyroid: Continue levothyroxin 6. History of laparoscopic gastric banding: Recently seen by surgery. Outpatient follow-up. No overt volume overload on clinical exam. Abdominal US without acute findings. 7. 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   Code Status: Full Family Communication:  None at bedside Disposition Plan: Home possibly 11/5   Consultants:  None  Procedures:  None  Antibiotics:  None   Subjective: Feels better. Still c/o generalized bloating sensation. NO CP/SOB. Tolerating diet.  Objective: Filed Vitals:   11/22/13 2100 11/22/13 2209 11/23/13 0528 11/23/13 0540  BP: 98/59 124/74 84/60 90/62   Pulse: 71 67 64   Temp:  98.2 F (36.8 C) 98.4 F (36.9 C)   TempSrc:  Oral Oral   Resp: 18 18 18    Height:      Weight:  58.4 kg (128 lb 12 oz)    SpO2: 99% 99% 99%     Intake/Output Summary (Last 24 hours) at 11/23/13 1204 Last data filed at 11/23/13 0800  Gross per 24 hour  Intake    240 ml  Output    300 ml  Net    -60 ml   Filed Weights   11/22/13 1649 11/22/13 2209  Weight: 54.432 kg (120 lb) 58.4 kg (128 lb 12 oz)     Exam:  General exam: Pleasant young female sitting comfortably in bed. Respiratory system: Clear. No increased work of breathing. Cardiovascular system: S1 & S2 heard, RRR. No JVD, murmurs, gallops, clicks or pedal edema. Tele: SR. Gastrointestinal system: Abdomen is nondistended, soft and nontender. Normal bowel sounds heard. Central nervous system: Alert and oriented. No focal neurological deficits. Extremities: Symmetric 5 x 5 power.   Data Reviewed: Basic Metabolic Panel:  Recent Labs Lab 11/22/13 1705 11/23/13 0421  NA 145 149*  K 2.2* 2.6*  CL 98 105  CO2 38* 33*  GLUCOSE 99 93  BUN 4* 3*  CREATININE 0.47* 0.47*  CALCIUM 8.5 7.7*  MG 1.6  --  Liver Function Tests:  Recent Labs Lab 11/22/13 1705 11/23/13 0421  AST 42* 28  ALT 24 17  ALKPHOS 57 43  BILITOT 0.2* <0.2*  PROT 6.6 5.0*  ALBUMIN 3.0* 2.2*   No results for input(s): LIPASE, AMYLASE in the last 168 hours. No results for input(s): AMMONIA in the last 168 hours. CBC:  Recent Labs Lab 11/22/13 1705 11/23/13 0421  WBC 6.7 5.6  NEUTROABS  --  3.2  HGB 11.6* 10.1*  HCT 34.8* 30.2*  MCV 81.5 82.3  PLT 261 223    Cardiac Enzymes: No results for input(s): CKTOTAL, CKMB, CKMBINDEX, TROPONINI in the last 168 hours. BNP (last 3 results)  Recent Labs  11/22/13 2036  PROBNP 819.9*   CBG: No results for input(s): GLUCAP in the last 168 hours.  No results found for this or any previous visit (from the past 240 hour(s)).      Studies: Dg Chest 2 View  11/22/2013   CLINICAL DATA:  Bilateral leg swelling for 3 days, shortness of breath.  EXAM: CHEST  2 VIEW  COMPARISON:  06/11/2010.  FINDINGS: Trachea is midline. Heart size normal. Mild asymmetric prominence of the right hilum is unchanged. Lungs are clear. No pleural fluid. Mild degenerative changes are seen in the spine.  IMPRESSION: No acute findings.   Electronically Signed   By: Lorin Picket M.D.   On: 11/22/2013 17:58   US Abdomen Complete  11/22/2013   CLINICAL DATA:  Abdominal pain. History of gastric lap band, cholecystectomy, and GERD.  EXAM: ULTRASOUND ABDOMEN COMPLETE  COMPARISON:  CT urogram 09/05/2008  FINDINGS: Gallbladder: Gallbladder is surgically absent.  Common bile duct: Diameter: 5 mm, normal  Liver: Calcification in the right lobe consistent with granuloma. This was present on previous CT. No focal lesion identified. Within normal limits in parenchymal echogenicity.  IVC: No abnormality visualized.  Pancreas: Visualized portion unremarkable.  Spleen: Size and appearance within normal limits.  Right Kidney: Length: 11.9 cm. Echogenicity within normal limits. No mass or hydronephrosis visualized.  Left Kidney: Length: 11.3 cm. Echogenicity within normal limits. No mass or hydronephrosis visualized.  Abdominal aorta: No aneurysm visualized.  Other findings: None.  IMPRESSION: Surgical absence of the gallbladder. Calcified granuloma in the liver. Examination is otherwise unremarkable.   Electronically Signed   By: Lucienne Capers M.D.   On: 11/22/2013 21:47        Scheduled Meds: . levothyroxine  125 mcg Oral QAC breakfast  .  potassium chloride  60 mEq Oral Once  . sodium chloride  3 mL Intravenous Q12H  . sodium chloride  3 mL Intravenous Q12H  . warfarin  4 mg Oral q1800  . Warfarin - Pharmacist Dosing Inpatient   Does not apply q1800   Continuous Infusions: . dextrose 125 mL (11/23/13 0906)    Principal Problem:   Hypokalemia Active Problems:   History of DVT (deep vein thrombosis)   Hypothyroidism   Normocytic normochromic anemia    Time spent: 35 minutes.    Vernell Leep, MD, FACP, FHM. [facility] Pager [phone]  If 7PM-7AM, please contact night-coverage [email or web address] Password [email or web address] 11/23/2013, 12:04 PM    LOS: 1 day

## 2013-11-24 DIAGNOSIS — E870 Hyperosmolality and hypernatremia: Secondary | ICD-10-CM | POA: Insufficient documentation

## 2013-11-24 LAB — CBC
HEMATOCRIT: 30.1 % — AB (ref 36.0–46.0)
Hemoglobin: 10 g/dL — ABNORMAL LOW (ref 12.0–15.0)
MCH: 27.5 pg (ref 26.0–34.0)
MCHC: 33.2 g/dL (ref 30.0–36.0)
MCV: 82.7 fL (ref 78.0–100.0)
PLATELETS: 203 10*3/uL (ref 150–400)
RBC: 3.64 MIL/uL — ABNORMAL LOW (ref 3.87–5.11)
RDW: 15.2 % (ref 11.5–15.5)
WBC: 6 10*3/uL (ref 4.0–10.5)

## 2013-11-24 LAB — BASIC METABOLIC PANEL
ANION GAP: 10 (ref 5–15)
Anion gap: 10 (ref 5–15)
BUN: 3 mg/dL — ABNORMAL LOW (ref 6–23)
BUN: 4 mg/dL — ABNORMAL LOW (ref 6–23)
CHLORIDE: 106 meq/L (ref 96–112)
CO2: 32 mEq/L (ref 19–32)
CO2: 30 mEq/L (ref 19–32)
Calcium: 7.5 mg/dL — ABNORMAL LOW (ref 8.4–10.5)
Calcium: 8.3 mg/dL — ABNORMAL LOW (ref 8.4–10.5)
Chloride: 104 mEq/L (ref 96–112)
Creatinine, Ser: 0.5 mg/dL (ref 0.50–1.10)
Creatinine, Ser: 0.51 mg/dL (ref 0.50–1.10)
GFR calc Af Amer: >90 mL/min (ref >90)
GFR calc non Af Amer: >90 mL/min (ref >90)
Glucose, Bld: 94 mg/dL (ref 70–99)
Glucose, Bld: 111 mg/dL — ABNORMAL HIGH (ref 70–99)
POTASSIUM: 2.9 meq/L — AB (ref 3.7–5.3)
POTASSIUM: 4.5 meq/L (ref 3.7–5.3)
SODIUM: 148 meq/L — AB (ref 137–147)
SODIUM: 144 meq/L (ref 137–147)

## 2013-11-24 LAB — CALCIUM / CREATININE RATIO, URINE
Calcium, Ur: 1 mg/dL
Calcium/Creat.Ratio: 0
Creatinine, Urine: 53.1 mg/dL

## 2013-11-24 LAB — MAGNESIUM: MAGNESIUM: 1.6 mg/dL (ref 1.5–2.5)

## 2013-11-24 LAB — PROTIME-INR
INR: 2.48 — AB (ref 0.00–1.49)
PROTHROMBIN TIME: 27 s — AB (ref 11.6–15.2)

## 2013-11-24 MED ORDER — DEXTROSE 5 % IV SOLN
INTRAVENOUS | Status: AC
  Administered 2013-11-24: 125 mL via INTRAVENOUS
  Administered 2013-11-24: 21:00:00 via INTRAVENOUS
  Administered 2013-11-24: 125 mL via INTRAVENOUS
  Administered 2013-11-24 – 2013-11-25 (×2): via INTRAVENOUS

## 2013-11-24 MED ORDER — MAGNESIUM SULFATE 2 GM/50ML IV SOLN
2.0000 g | Freq: Once | INTRAVENOUS | Status: AC
  Administered 2013-11-24: 2 g via INTRAVENOUS
  Filled 2013-11-24: qty 50

## 2013-11-24 MED ORDER — POTASSIUM CHLORIDE CRYS ER 20 MEQ PO TBCR
40.0000 meq | EXTENDED_RELEASE_TABLET | ORAL | Status: AC
  Administered 2013-11-24 (×2): 40 meq via ORAL
  Filled 2013-11-24 (×2): qty 2

## 2013-11-24 MED ORDER — POTASSIUM CHLORIDE CRYS ER 20 MEQ PO TBCR
40.0000 meq | EXTENDED_RELEASE_TABLET | ORAL | Status: DC
  Administered 2013-11-24: 40 meq via ORAL
  Filled 2013-11-24: qty 2

## 2013-11-24 NOTE — Progress Notes (Signed)
K.KIRBY N.NP. RETURN CALL AND AWARE OF POTISSIUM LEVEL 2.9.

## 2013-11-24 NOTE — Progress Notes (Signed)
PROGRESS NOTE    Laura Molina HCW:[id number] DOB: 1961/01/06 DOA: 11/22/2013 PCP: Kandice Hams, MD  General Surgery: Dr. Excell Seltzer  HPI/Brief narrative 53 year old female patient with history of recurrent DVT on Coumadin, hypothyroidism, status post laparoscopic gastric banding 8 years ago, presented to the ED with complaints of generalized bloating/fullness and was found to have severe hypokalemia. She states that she was in her usual state of health until a month ago when her grandson demised following which she was down and had nausea and vomiting 2 weeks associated with poor oral intake. She was seen by Dr. Excell Seltzer a week ago and her gastric banding was loosened following which she was able to tolerate for better without any further nausea or vomiting. She however noticed bloating sensation and states that she gained 12 pounds in 1 week. She was seen by her PCP recently. She presented to the ED yesterday due to continued bloating sensation and "funny feeling in her chest". She denies specific chest pain. Complains of some dyspnea related to anxiety.   Assessment/Plan:  1. Severe hypokalemia:likely secondary to poor oral intake and GI losses. Replace aggressively and follow BMP closely. Continue monitoring on telemetry. Improved yesterday and back to 2.6 this am. Replace K & Mg and follow closely. 2. Hypernatremic dehydration: Secondary to poor oral intake and GI losses. Hydrate with IV D5 W and monitor BMP closely. By mouth diet as tolerated. Encouraged free water intake- requested nursing to leave water pitcher at bedside. Na had improved and again worsened. 3. Anemia: Likely of chronic disease. No reported bleeding. Stable. 4. History of recurrent DVT on Coumadin: INR therapeutic at 2.48-management per pharmacy. 5. History of hypothyroid: Continue levothyroxine 6. History of laparoscopic gastric banding: Recently seen by surgery. Outpatient follow-up. No overt volume overload  on clinical exam. Abdominal US without acute findings. Tolerating diet without symptoms and having BM's. 7. 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    Code Status: Full Family Communication: None at bedside Disposition Plan: Home possibly 11/7   Consultants:  None  Procedures:  None  Antibiotics:  None   Subjective: Tolerating diet without N/V/abd pain. Having BM's. Felt brief palpitations this am- not seen on tele.  Objective: Filed Vitals:   11/23/13 1500 11/23/13 1955 11/24/13 0513 11/24/13 1446  BP: 94/60 96/68 97/65  105/58  Pulse: 78 108 71 77  Temp: 98.4 F (36.9 C) 98.4 F (36.9 C) 98.3 F (36.8 C) 98.5 F (36.9 C)  TempSrc: Oral Oral Oral Oral  Resp: 18 20  18   Height:      Weight:   60.102 kg (132 lb 8 oz)   SpO2: 100% 95% 100% 100%    Intake/Output Summary (Last 24 hours) at 11/24/13 1646 Last data filed at 11/24/13 1300  Gross per 24 hour  Intake   1860 ml  Output    725 ml  Net   1135 ml   Filed Weights   11/22/13 1649 11/22/13 2209 11/24/13 0513  Weight: 54.432 kg (120 lb) 58.4 kg (128 lb 12 oz) 60.102 kg (132 lb 8 oz)     Exam:  General exam: Pleasant young female sitting comfortably in bed. Respiratory system: Clear. No increased work of breathing. Cardiovascular system: S1 & S2 heard, RRR. No JVD, murmurs, gallops, clicks or pedal edema. Tele: SR. Gastrointestinal system: Abdomen is nondistended, soft and nontender. Normal bowel sounds heard. Central nervous system: Alert and oriented. No focal neurological deficits. Extremities: Symmetric 5  x 5 power.   Data Reviewed: Basic Metabolic Panel:  Recent Labs Lab 11/22/13 1705 11/23/13 0421 11/23/13 1222 11/24/13 0336 11/24/13 0630  NA 145 149* 146 148*  --   K 2.2* 2.6* 3.5* 2.9*  --   CL 98 105 105 106  --   CO2 38* 33* 33* 32  --   GLUCOSE 99 93 98 94  --   BUN 4* 3* 4* 3*  --   CREATININE 0.47* 0.47* 0.50 0.50  --     CALCIUM 8.5 7.7* 7.9* 7.5*  --   MG 1.6  --   --   --  1.6   Liver Function Tests:  Recent Labs Lab 11/22/13 1705 11/23/13 0421  AST 42* 28  ALT 24 17  ALKPHOS 57 43  BILITOT 0.2* <0.2*  PROT 6.6 5.0*  ALBUMIN 3.0* 2.2*   No results for input(s): LIPASE, AMYLASE in the last 168 hours. No results for input(s): AMMONIA in the last 168 hours. CBC:  Recent Labs Lab 11/22/13 1705 11/23/13 0421 11/24/13 0336  WBC 6.7 5.6 6.0  NEUTROABS  --  3.2  --   HGB 11.6* 10.1* 10.0*  HCT 34.8* 30.2* 30.1*  MCV 81.5 82.3 82.7  PLT 261 223 203   Cardiac Enzymes: No results for input(s): CKTOTAL, CKMB, CKMBINDEX, TROPONINI in the last 168 hours. BNP (last 3 results)  Recent Labs  11/22/13 2036  PROBNP 819.9*   CBG: No results for input(s): GLUCAP in the last 168 hours.  Recent Results (from the past 240 hour(s))  Urine culture     Status: None   Collection Time: 11/22/13  8:14 PM  Result Value Ref Range Status   Specimen Description URINE, CLEAN CATCH  Final   Special Requests NONE  Final   Culture  Setup Time   Final    11/23/2013 00:44 Performed at [facility] Performed at [facility]   Final   Culture NO GROWTH Performed at [facility]   Final   Report Status 11/23/2013 FINAL  Final        Studies: Dg Chest 2 View  11/22/2013   CLINICAL DATA:  Bilateral leg swelling for 3 days, shortness of breath.  EXAM: CHEST  2 VIEW  COMPARISON:  06/11/2010.  FINDINGS: Trachea is midline. Heart size normal. Mild asymmetric prominence of the right hilum is unchanged. Lungs are clear. No pleural fluid. Mild degenerative changes are seen in the spine.  IMPRESSION: No acute findings.   Electronically Signed   By: Lorin Picket M.D.   On: 11/22/2013 17:58   US Abdomen Complete  11/22/2013   CLINICAL DATA:  Abdominal pain. History of gastric lap band, cholecystectomy, and GERD.  EXAM: ULTRASOUND ABDOMEN COMPLETE  COMPARISON:   CT urogram 09/05/2008  FINDINGS: Gallbladder: Gallbladder is surgically absent.  Common bile duct: Diameter: 5 mm, normal  Liver: Calcification in the right lobe consistent with granuloma. This was present on previous CT. No focal lesion identified. Within normal limits in parenchymal echogenicity.  IVC: No abnormality visualized.  Pancreas: Visualized portion unremarkable.  Spleen: Size and appearance within normal limits.  Right Kidney: Length: 11.9 cm. Echogenicity within normal limits. No mass or hydronephrosis visualized.  Left Kidney: Length: 11.3 cm. Echogenicity within normal limits. No mass or hydronephrosis visualized.  Abdominal aorta: No aneurysm visualized.  Other findings: None.  IMPRESSION: Surgical absence of the gallbladder. Calcified granuloma in the liver. Examination is otherwise unremarkable.  Electronically Signed   By: Lucienne Capers M.D.   On: 11/22/2013 21:47        Scheduled Meds: . coumadin book   Does not apply Once  . levothyroxine  125 mcg Oral QAC breakfast  . sodium chloride  3 mL Intravenous Q12H  . sodium chloride  3 mL Intravenous Q12H  . warfarin  4 mg Oral q1800  . Warfarin - Pharmacist Dosing Inpatient   Does not apply q1800   Continuous Infusions: . dextrose 125 mL (11/24/13 1013)    Principal Problem:   Hypokalemia Active Problems:   History of DVT (deep vein thrombosis)   Hypothyroidism   Normocytic normochromic anemia    Time spent: 35 minutes.    Vernell Leep, MD, FACP, FHM. [facility] Pager [phone]  If 7PM-7AM, please contact night-coverage [email or web address] Password [email or web address] 11/24/2013, 4:46 PM    LOS: 2 days

## 2013-11-24 NOTE — Plan of Care (Signed)
Problem: Phase II Progression Outcomes Goal: Anginal pain relieved Outcome: Progressing

## 2013-11-24 NOTE — Progress Notes (Signed)
Lab call with low potassium 2.9 paged K Kirby N.P. Awaiting return call

## 2013-11-24 NOTE — Plan of Care (Signed)
Problem: Phase II Progression Outcomes Goal: Hemodynamically stable Outcome: Completed/Met Date Met:  11/24/13 Goal: Anginal pain relieved Outcome: Completed/Met Date Met:  11/24/13 Goal: Stress Test if indicated Outcome: Not Applicable Date Met:  87/57/97 Goal: Cath/PCI Day Path if indicated Outcome: Not Applicable Date Met:  28/20/60 Goal: If positive for MI, change to MI Path Outcome: Not Applicable Date Met:  15/61/53

## 2013-11-24 NOTE — Progress Notes (Signed)
ANTICOAGULATION CONSULT NOTE - Follow Up Consult  Pharmacy Consult for Coumadin Indication: Hx DVT  Allergies  Allergen Reactions  . Aspirin     D/t coumadin  . Latex Rash  . Motrin [Ibuprofen] Rash    Patient Measurements: Height: 5' (152.4 cm) Weight: 132 lb 8 oz (60.102 kg) IBW/kg (Calculated) : 45.5 Heparin Dosing Weight:   Vital Signs: Temp: 98.3 F (36.8 C) (11/06 0513) Temp Source: Oral (11/06 0513) BP: 97/65 mmHg (11/06 0513) Pulse Rate: 71 (11/06 0513)  Labs:  Recent Labs  11/22/13 1705 11/22/13 2035 11/23/13 0421 11/23/13 1222 11/24/13 0336  HGB 11.6*  --  10.1*  --  10.0*  HCT 34.8*  --  30.2*  --  30.1*  PLT 261  --  223  --  203  LABPROT  --  24.9* 27.0*  --  27.0*  INR  --  2.23* 2.48*  --  2.48*  CREATININE 0.47*  --  0.47* 0.50 0.50    Estimated Creatinine Clearance: 67.4 mL/min (by C-G formula based on Cr of 0.5).  Assessment: 53yof on chronic Coumadin for hx DVT. INR (2.48) remains therapeutic and stable on PTA regimen (4mg  daily) - will continue and follow-up AM INR. Will plan to change to MWF INR if stable tomorrow AM. - H/H and Plts stable - No significant bleeding reported  Goal of Therapy:  INR 2-3   Plan:  1. Continue Coumadin 4mg  daily @ 1800 2. Daily INR  Earleen Newport  [phone] 11/24/2013,10:23 AM

## 2013-11-25 LAB — BASIC METABOLIC PANEL
ANION GAP: 8 (ref 5–15)
BUN: 4 mg/dL — ABNORMAL LOW (ref 6–23)
CO2: 30 mEq/L (ref 19–32)
CREATININE: 0.6 mg/dL (ref 0.50–1.10)
Calcium: 8 mg/dL — ABNORMAL LOW (ref 8.4–10.5)
Chloride: 102 mEq/L (ref 96–112)
GFR calc non Af Amer: >90 mL/min (ref >90)
Glucose, Bld: 89 mg/dL (ref 70–99)
Potassium: 3.7 mEq/L (ref 3.7–5.3)
Sodium: 140 mEq/L (ref 137–147)

## 2013-11-25 LAB — PROTIME-INR
INR: 2.27 — AB (ref 0.00–1.49)
PROTHROMBIN TIME: 25.3 s — AB (ref 11.6–15.2)

## 2013-11-25 MED ORDER — POTASSIUM CHLORIDE CRYS ER 20 MEQ PO TBCR
20.0000 meq | EXTENDED_RELEASE_TABLET | Freq: Every day | ORAL | Status: DC
  Administered 2013-11-25: 20 meq via ORAL
  Filled 2013-11-25: qty 1

## 2013-11-25 MED ORDER — POTASSIUM CHLORIDE CRYS ER 20 MEQ PO TBCR: 20.0000 meq | EXTENDED_RELEASE_TABLET | Freq: Every day | ORAL | Status: DC

## 2013-11-25 NOTE — Discharge Summary (Signed)
Physician Discharge Summary  Ahna Konkle PPI:[id number] DOB: [birth year] DOA: 11/22/2013  PCP: Kandice Hams, MD  Admit date: 11/22/2013 Discharge date: 11/25/2013  Time spent: Less than 30 minutes  Recommendations for Outpatient Follow-up:  1. Dr. Cristy Folks, PCP in 5 days wit repeat labs (CBC, BMP & Mg). Follow-up and evaluate as deemed necessary the calcified granuloma in the liver that was seen incidentally on ultrasound  Discharge Diagnoses:  Principal Problem:   Hypokalemia Active Problems:   History of DVT (deep vein thrombosis)   Hypothyroidism   Normocytic normochromic anemia   Hypernatremia   Discharge Condition: Improved & Stable  Diet recommendation: Regular diet.  Filed Weights   11/22/13 2209 11/24/13 0513 11/25/13 0604  Weight: 58.4 kg (128 lb 12 oz) 60.102 kg (132 lb 8 oz) 60.147 kg (132 lb 9.6 oz)    History of present illness:  53 year old female patient with history of recurrent DVT on Coumadin, hypothyroidism, status post laparoscopic gastric banding 8 years ago, presented to the ED with complaints of generalized bloating/fullness and was found to have severe hypokalemia. She states that she was in her usual state of health until a month ago when her grandson demised following which she was down and had nausea and vomiting 2 weeks associated with poor oral intake. She was seen by Dr. Excell Seltzer a week ago and her gastric banding was loosened following which she was able to tolerate for better without any further nausea or vomiting. She however noticed bloating sensation and states that she gained 12 pounds in 1 week. She was seen by her PCP recently. She presented to the ED yesterday due to continued bloating sensation and "funny feeling in her chest". She denies specific chest pain. Complains of some dyspnea related to anxiety.  Hospital Course:    1. Severe hypokalemia:likely secondary to poor oral intake and GI losses. Replaced aggressively & resolved.  She is advised to increase potassium in her diet as has been advised by her outpatient PCP. She will also be discharged on a short supply of oral potassium supplements. Recommend follow-up of BMP in a few days. 2. Hypernatremic dehydration: Secondary to poor oral intake and GI losses. Hydrated with IV D5 W and free water PO and reolved. FU BMP OP 3. Anemia: Likely of chronic disease. No reported bleeding. Stable. 4. History of recurrent DVT on Coumadin: INR therapeutic.  5. History of hypothyroid: Continue levothyroxine 6. History of laparoscopic gastric banding: Recently seen by surgery. Outpatient follow-up. No overt volume overload on clinical exam. Abdominal US without acute findings. Tolerating diet without symptoms and having BM's. 7. 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 8. Soft blood pressures: Patient complains of occasional mild dizziness but denies presyncopal or syncopal episodes. Advised patient regarding gradual orthostatic measures and increase salt in her diet. She verbalizes understanding.  Consultations:  None  Procedures:  None    Discharge Exam:  Complaints:  tolerating diet. Denies palpitations, dyspnea or chest pain. Occasional dizziness on ambulating.  Filed Vitals:   11/24/13 2006 11/25/13 0604 11/25/13 0627 11/25/13 0629  BP: 105/72 79/56 90/60  94/60  Pulse: 86 78    Temp: 98.6 F (37 C) 98 F (36.7 C)    TempSrc: Oral Oral    Resp: 18 18    Height:      Weight:  60.147 kg (132 lb 9.6 oz)    SpO2: 100% 100%     General exam: Pleasant young female sitting comfortably in  bed. Respiratory system: Clear. No increased work of breathing. Cardiovascular system: S1 & S2 heard, RRR. No JVD, murmurs, gallops, clicks or pedal edema. Tele: SR. Gastrointestinal system: Abdomen is nondistended, soft and nontender. Normal bowel sounds heard. Central nervous system: Alert and oriented. No focal neurological  deficits. Extremities: Symmetric 5 x 5 power.  Discharge Instructions      Discharge Instructions    Call MD for:  extreme fatigue    Complete by:  As directed      Call MD for:  persistant dizziness or light-headedness    Complete by:  As directed      Diet general    Complete by:  As directed      Increase activity slowly    Complete by:  As directed             Medication List    TAKE these medications        levothyroxine 125 MCG tablet  Commonly known as:  SYNTHROID, LEVOTHROID  Take 125 mcg by mouth daily.     potassium chloride SA 20 MEQ tablet  Commonly known as:  K-DUR,KLOR-CON  Take 1 tablet (20 mEq total) by mouth daily.     warfarin 4 MG tablet  Commonly known as:  COUMADIN  Take 4 mg by mouth daily.       Follow-up Information    Follow up with POLITE,RONALD D, MD. Schedule an appointment as soon as possible for a visit in 5 days.   Specialty:  Internal Medicine   Why:  To be seen with repeat labs (CBC, BMP & Mg).   Contact information:   [address] [place] [address] [phone]        The results of significant diagnostics from this hospitalization (including imaging, microbiology, ancillary and laboratory) are listed below for reference.    Significant Diagnostic Studies: Dg Chest 2 View  11/22/2013   CLINICAL DATA:  Bilateral leg swelling for 3 days, shortness of breath.  EXAM: CHEST  2 VIEW  COMPARISON:  06/11/2010.  FINDINGS: Trachea is midline. Heart size normal. Mild asymmetric prominence of the right hilum is unchanged. Lungs are clear. No pleural fluid. Mild degenerative changes are seen in the spine.  IMPRESSION: No acute findings.   Electronically Signed   By: Lorin Picket M.D.   On: 11/22/2013 17:58   US Abdomen Complete  11/22/2013   CLINICAL DATA:  Abdominal pain. History of gastric lap band, cholecystectomy, and GERD.  EXAM: ULTRASOUND ABDOMEN COMPLETE  COMPARISON:  CT urogram 09/05/2008  FINDINGS:  Gallbladder: Gallbladder is surgically absent.  Common bile duct: Diameter: 5 mm, normal  Liver: Calcification in the right lobe consistent with granuloma. This was present on previous CT. No focal lesion identified. Within normal limits in parenchymal echogenicity.  IVC: No abnormality visualized.  Pancreas: Visualized portion unremarkable.  Spleen: Size and appearance within normal limits.  Right Kidney: Length: 11.9 cm. Echogenicity within normal limits. No mass or hydronephrosis visualized.  Left Kidney: Length: 11.3 cm. Echogenicity within normal limits. No mass or hydronephrosis visualized.  Abdominal aorta: No aneurysm visualized.  Other findings: None.  IMPRESSION: Surgical absence of the gallbladder. Calcified granuloma in the liver. Examination is otherwise unremarkable.   Electronically Signed   By: Lucienne Capers M.D.   On: 11/22/2013 21:47    Microbiology: Recent Results (from the past 240 hour(s))  Urine culture     Status: None   Collection Time: 11/22/13  8:14 PM  Result Value Ref Range Status   Specimen Description URINE, CLEAN CATCH  Final   Special Requests NONE  Final   Culture  Setup Time   Final    11/23/2013 00:44 Performed at [facility] Performed at [facility]   Final   Culture NO GROWTH Performed at [facility]   Final   Report Status 11/23/2013 FINAL  Final     Labs: Basic Metabolic Panel:  Recent Labs Lab 11/22/13 1705 11/23/13 0421 11/23/13 1222 11/24/13 0336 11/24/13 0630 11/24/13 1630 11/25/13 0430  NA 145 149* 146 148*  --  144 140  K 2.2* 2.6* 3.5* 2.9*  --  4.5 3.7  CL 98 105 105 106  --  104 102  CO2 38* 33* 33* 32  --  30 30  GLUCOSE 99 93 98 94  --  111* 89  BUN 4* 3* 4* 3*  --  4* 4*  CREATININE 0.47* 0.47* 0.50 0.50  --  0.51 0.60  CALCIUM 8.5 7.7* 7.9* 7.5*  --  8.3* 8.0*  MG 1.6  --   --   --  1.6  --   --    Liver Function Tests:  Recent Labs Lab 11/22/13 1705  11/23/13 0421  AST 42* 28  ALT 24 17  ALKPHOS 57 43  BILITOT 0.2* <0.2*  PROT 6.6 5.0*  ALBUMIN 3.0* 2.2*   No results for input(s): LIPASE, AMYLASE in the last 168 hours. No results for input(s): AMMONIA in the last 168 hours. CBC:  Recent Labs Lab 11/22/13 1705 11/23/13 0421 11/24/13 0336  WBC 6.7 5.6 6.0  NEUTROABS  --  3.2  --   HGB 11.6* 10.1* 10.0*  HCT 34.8* 30.2* 30.1*  MCV 81.5 82.3 82.7  PLT 261 223 203   Cardiac Enzymes: No results for input(s): CKTOTAL, CKMB, CKMBINDEX, TROPONINI in the last 168 hours. BNP: BNP (last 3 results)  Recent Labs  11/22/13 2036  PROBNP 819.9*   CBG: No results for input(s): GLUCAP in the last 168 hours.     Signed:  Vernell Leep, MD, FACP, FHM. [facility] Pager [phone]  If 7PM-7AM, please contact night-coverage [email or web address] Password [email or web address] 11/25/2013, 11:41 AM

## 2013-11-25 NOTE — Plan of Care (Signed)
Problem: Phase III Progression Outcomes Goal: Hemodynamically stable Outcome: Completed/Met Date Met:  11/25/13

## 2013-11-25 NOTE — Progress Notes (Signed)
Discharged to home with family office visits in place teaching done  

## 2014-09-13 ENCOUNTER — Encounter (HOSPITAL_COMMUNITY): Payer: Self-pay

## 2014-09-13 ENCOUNTER — Encounter (HOSPITAL_COMMUNITY)
Admission: RE | Admit: 2014-09-13 | Discharge: 2014-09-13 | Disposition: A | Discharge status: Home / Self Care | Payer: Managed Care, Other (non HMO) | Source: Ambulatory Visit | Attending: Orthopedic Surgery | Admitting: Orthopedic Surgery

## 2014-09-13 DIAGNOSIS — M5012 Cervical disc disorder with radiculopathy, mid-cervical region: Secondary | ICD-10-CM | POA: Insufficient documentation

## 2014-09-13 DIAGNOSIS — Z01812 Encounter for preprocedural laboratory examination: Secondary | ICD-10-CM | POA: Diagnosis present

## 2014-09-13 HISTORY — DX: Depression, unspecified: F32.A

## 2014-09-13 HISTORY — DX: Dizziness and giddiness: R42

## 2014-09-13 HISTORY — DX: Headache, unspecified: R51.9

## 2014-09-13 HISTORY — DX: Major depressive disorder, single episode, unspecified: F32.9

## 2014-09-13 HISTORY — DX: Hypothyroidism, unspecified: E03.9

## 2014-09-13 HISTORY — DX: Headache: R51

## 2014-09-13 LAB — BASIC METABOLIC PANEL
ANION GAP: 11 (ref 5–15)
BUN: 6 mg/dL (ref 6–20)
CALCIUM: 9.7 mg/dL (ref 8.9–10.3)
CO2: 29 mmol/L (ref 22–32)
Chloride: 105 mmol/L (ref 101–111)
Creatinine, Ser: 0.74 mg/dL (ref 0.44–1.00)
GFR calc non Af Amer: >60 mL/min (ref >60)
Glucose, Bld: 94 mg/dL (ref 65–99)
POTASSIUM: 3.5 mmol/L (ref 3.5–5.1)
Sodium: 145 mmol/L (ref 135–145)

## 2014-09-13 LAB — CBC
HEMATOCRIT: 42.1 % (ref 36.0–46.0)
HEMOGLOBIN: 14.3 g/dL (ref 12.0–15.0)
MCH: 28.1 pg (ref 26.0–34.0)
MCHC: 34 g/dL (ref 30.0–36.0)
MCV: 82.7 fL (ref 78.0–100.0)
Platelets: 243 10*3/uL (ref 150–400)
RBC: 5.09 MIL/uL (ref 3.87–5.11)
RDW: 13.8 % (ref 11.5–15.5)
WBC: 8 10*3/uL (ref 4.0–10.5)

## 2014-09-13 LAB — PROTIME-INR
INR: 1.76 — ABNORMAL HIGH (ref 0.00–1.49)
PROTHROMBIN TIME: 20.5 s — AB (ref 11.6–15.2)

## 2014-09-13 LAB — SURGICAL PCR SCREEN
MRSA, PCR: NEGATIVE
STAPHYLOCOCCUS AUREUS: NEGATIVE

## 2014-09-13 NOTE — Progress Notes (Signed)
Patient states she has not been fitted for brace, office was called and will check and contact patient. Patient stated she was instructed by Dr. Delfina Redwood to stop coumadin 09/15/14.

## 2014-09-13 NOTE — Pre-Procedure Instructions (Signed)
    Laura Molina  09/13/2014      [id number] [address] [place] [address] Phone: [phone] Fax: [phone]    Your procedure is scheduled on Thursday, September 20, 2014  Report to [facility] Admitting at 5:30 A.M.  Call this number if you have problems the morning of surgery:  [phone]   Remember: Bring the brace to the hospital with you on day of procedure.  Do not eat food or drink liquids after midnight Wednesday, September 19, 2014  Take these medicines the morning of surgery with A SIP OF WATER :levothyroxine (SYNTHROID, LEVOTHROID) if needed:traMADol (ULTRAM) for pain.   Stop taking Aspirin, Coumadin, Plavix, Effient, and herbal medications. Do not take any NSAIDs ie: Ibuprofen, Advil, Naproxen or any medication containing Aspirin; stop 5 days prior to procedure.   Do not wear jewelry, make-up or nail polish.  Do not wear lotions, powders, or perfumes.  You may not wear deodorant.  Do not shave 48 hours prior to surgery.   Do not bring valuables to the hospital.  [facility] is not responsible for any belongings or valuables.  Contacts, dentures or bridgework may not be worn into surgery.  Leave your suitcase in the car.  After surgery it may be brought to your room.  For patients admitted to the hospital, discharge time will be determined by your treatment team.  Patients discharged the day of surgery will not be allowed to drive home.   Name and phone number of your driver:    Special instructions: Shower the night before surgery and the morning of surgery with CHG.  Please read over the following fact sheets that you were given. Pain Booklet, Coughing and Deep Breathing, MRSA Information and Surgical Site Infection Prevention

## 2014-09-18 ENCOUNTER — Other Ambulatory Visit: Payer: Self-pay | Admitting: Internal Medicine

## 2014-09-18 DIAGNOSIS — Z1231 Encounter for screening mammogram for malignant neoplasm of breast: Secondary | ICD-10-CM

## 2014-09-19 MED ORDER — ACETAMINOPHEN 10 MG/ML IV SOLN
1000.0000 mg | Freq: Four times a day (QID) | INTRAVENOUS | Status: DC
  Administered 2014-09-20: 1000 mg via INTRAVENOUS
  Filled 2014-09-19 (×2): qty 100

## 2014-09-19 MED ORDER — CEFAZOLIN SODIUM-DEXTROSE 2-3 GM-% IV SOLR
2.0000 g | INTRAVENOUS | Status: AC
  Administered 2014-09-20: 2 g via INTRAVENOUS
  Filled 2014-09-19: qty 50

## 2014-09-19 MED ORDER — DEXAMETHASONE SODIUM PHOSPHATE 4 MG/ML IJ SOLN
4.0000 mg | INTRAMUSCULAR | Status: AC
  Filled 2014-09-19: qty 1

## 2014-09-19 NOTE — Anesthesia Preprocedure Evaluation (Addendum)
Anesthesia Evaluation  Patient identified by MRN, date of birth, ID band Patient awake    Reviewed: Allergy & Precautions, NPO status , Patient's Chart, lab work & pertinent test results  History of Anesthesia Complications (+) PONV and PROLONGED EMERGENCE  Airway Mallampati: II   Neck ROM: Full    Dental  (+) Teeth Intact, Dental Advisory Given   Pulmonary former smoker (quit 1995),  breath sounds clear to auscultation        Cardiovascular DVT (IVC, has beren on coumadin) Rhythm:Regular  EKG 11/2013 OK   Neuro/Psych  Headaches, Depression    GI/Hepatic GERD-  Medicated,SP lap Band 8 years ago   Endo/Other  Hypothyroidism   Renal/GU      Musculoskeletal   Abdominal (+)  Abdomen: soft.    Peds  Hematology 14/42   Anesthesia Other Findings   Reproductive/Obstetrics                           Anesthesia Physical Anesthesia Plan  ASA: III  Anesthesia Plan: General   Post-op Pain Management:    Induction: Intravenous  Airway Management Planned: Oral ETT and Video Laryngoscope Planned  Additional Equipment:   Intra-op Plan:   Post-operative Plan:   Informed Consent: I have reviewed the patients History and Physical, chart, labs and discussed the procedure including the risks, benefits and alternatives for the proposed anesthesia with the patient or authorized representative who has indicated his/her understanding and acceptance.     Plan Discussed with:   Anesthesia Plan Comments: (2nd Iv after induction, Glide available, multimodal pain RX, ask about coumadin)       Anesthesia Quick Evaluation

## 2014-09-20 ENCOUNTER — Observation Stay (HOSPITAL_COMMUNITY)
Admission: RE | Admit: 2014-09-20 | Discharge: 2014-09-21 | Disposition: A | Discharge status: Home / Self Care | Payer: Managed Care, Other (non HMO) | Source: Ambulatory Visit | Attending: Orthopedic Surgery | Admitting: Orthopedic Surgery

## 2014-09-20 ENCOUNTER — Ambulatory Visit (HOSPITAL_COMMUNITY): Payer: Managed Care, Other (non HMO)

## 2014-09-20 ENCOUNTER — Encounter (HOSPITAL_COMMUNITY): Payer: Self-pay | Admitting: *Deleted

## 2014-09-20 ENCOUNTER — Encounter (HOSPITAL_COMMUNITY)
Admission: RE | Disposition: A | Discharge status: Home / Self Care | Payer: Managed Care, Other (non HMO) | Source: Ambulatory Visit | Attending: Orthopedic Surgery

## 2014-09-20 ENCOUNTER — Ambulatory Visit (HOSPITAL_COMMUNITY): Payer: Managed Care, Other (non HMO) | Admitting: Anesthesiology

## 2014-09-20 ENCOUNTER — Observation Stay (HOSPITAL_COMMUNITY): Payer: Managed Care, Other (non HMO)

## 2014-09-20 DIAGNOSIS — Z87891 Personal history of nicotine dependence: Secondary | ICD-10-CM | POA: Diagnosis not present

## 2014-09-20 DIAGNOSIS — M5012 Cervical disc disorder with radiculopathy, mid-cervical region: Principal | ICD-10-CM | POA: Insufficient documentation

## 2014-09-20 DIAGNOSIS — E039 Hypothyroidism, unspecified: Secondary | ICD-10-CM | POA: Diagnosis not present

## 2014-09-20 DIAGNOSIS — Z981 Arthrodesis status: Secondary | ICD-10-CM

## 2014-09-20 DIAGNOSIS — Z79899 Other long term (current) drug therapy: Secondary | ICD-10-CM | POA: Insufficient documentation

## 2014-09-20 DIAGNOSIS — M542 Cervicalgia: Secondary | ICD-10-CM | POA: Diagnosis present

## 2014-09-20 DIAGNOSIS — Z419 Encounter for procedure for purposes other than remedying health state, unspecified: Secondary | ICD-10-CM

## 2014-09-20 HISTORY — PX: CERVICAL DISC ARTHROPLASTY: SHX587

## 2014-09-20 LAB — PROTIME-INR
INR: 1.1 (ref 0.00–1.49)
PROTHROMBIN TIME: 14.4 s (ref 11.6–15.2)

## 2014-09-20 SURGERY — CERVICAL ANTERIOR DISC ARTHROPLASTY
Anesthesia: General | Site: Neck

## 2014-09-20 MED ORDER — DEXAMETHASONE SODIUM PHOSPHATE 10 MG/ML IJ SOLN
INTRAMUSCULAR | Status: DC | PRN
  Administered 2014-09-20: 10 mg via INTRAVENOUS

## 2014-09-20 MED ORDER — SCOPOLAMINE 1 MG/3DAYS TD PT72
1.0000 | MEDICATED_PATCH | Freq: Once | TRANSDERMAL | Status: DC
  Administered 2014-09-20: 1.5 mg via TRANSDERMAL

## 2014-09-20 MED ORDER — LIDOCAINE HCL (CARDIAC) 20 MG/ML IV SOLN
INTRAVENOUS | Status: AC
  Filled 2014-09-20: qty 5

## 2014-09-20 MED ORDER — BUPIVACAINE-EPINEPHRINE 0.25% -1:200000 IJ SOLN
INTRAMUSCULAR | Status: DC | PRN
  Administered 2014-09-20: 4 mL

## 2014-09-20 MED ORDER — PHENOL 1.4 % MT LIQD
1.0000 | OROMUCOSAL | Status: DC | PRN
  Administered 2014-09-20: 1 via OROMUCOSAL
  Filled 2014-09-20: qty 177

## 2014-09-20 MED ORDER — ONDANSETRON HCL 4 MG/2ML IJ SOLN: 4.0000 mg | INTRAMUSCULAR | Status: DC | PRN

## 2014-09-20 MED ORDER — OXYCODONE HCL 5 MG PO TABS
5.0000 mg | ORAL_TABLET | ORAL | Status: DC | PRN
  Administered 2014-09-20 – 2014-09-21 (×4): 10 mg via ORAL
  Filled 2014-09-20 (×4): qty 2

## 2014-09-20 MED ORDER — ACETAMINOPHEN 10 MG/ML IV SOLN
1000.0000 mg | Freq: Four times a day (QID) | INTRAVENOUS | Status: AC
  Administered 2014-09-20 – 2014-09-21 (×4): 1000 mg via INTRAVENOUS
  Filled 2014-09-20 (×4): qty 100

## 2014-09-20 MED ORDER — HYDROMORPHONE HCL 1 MG/ML IJ SOLN
INTRAMUSCULAR | Status: AC
  Filled 2014-09-20: qty 2

## 2014-09-20 MED ORDER — SODIUM CHLORIDE 0.9 % IJ SOLN: 3.0000 mL | INTRAMUSCULAR | Status: DC | PRN

## 2014-09-20 MED ORDER — MORPHINE SULFATE (PF) 2 MG/ML IV SOLN
1.0000 mg | INTRAVENOUS | Status: DC | PRN
  Administered 2014-09-20 (×2): 4 mg via INTRAVENOUS
  Filled 2014-09-20 (×2): qty 2

## 2014-09-20 MED ORDER — THROMBIN 20000 UNITS EX SOLR
CUTANEOUS | Status: AC
  Filled 2014-09-20: qty 20000

## 2014-09-20 MED ORDER — METHOCARBAMOL 500 MG PO TABS: 500.0000 mg | ORAL_TABLET | Freq: Three times a day (TID) | ORAL | Status: DC | PRN

## 2014-09-20 MED ORDER — PHENYLEPHRINE HCL 10 MG/ML IJ SOLN
INTRAMUSCULAR | Status: DC | PRN
  Administered 2014-09-20 (×2): 80 ug via INTRAVENOUS

## 2014-09-20 MED ORDER — ONDANSETRON HCL 4 MG PO TABS: 4.0000 mg | ORAL_TABLET | Freq: Three times a day (TID) | ORAL | Status: DC | PRN

## 2014-09-20 MED ORDER — SUCCINYLCHOLINE CHLORIDE 20 MG/ML IJ SOLN
INTRAMUSCULAR | Status: AC
  Filled 2014-09-20: qty 1

## 2014-09-20 MED ORDER — SODIUM CHLORIDE 0.9 % IJ SOLN
3.0000 mL | Freq: Two times a day (BID) | INTRAMUSCULAR | Status: DC
  Administered 2014-09-20: 3 mL via INTRAVENOUS

## 2014-09-20 MED ORDER — SCOPOLAMINE 1 MG/3DAYS TD PT72
MEDICATED_PATCH | TRANSDERMAL | Status: AC
  Filled 2014-09-20: qty 1

## 2014-09-20 MED ORDER — SODIUM CHLORIDE 0.9 % IV SOLN: 250.0000 mL | INTRAVENOUS | Status: DC

## 2014-09-20 MED ORDER — METHOCARBAMOL 500 MG PO TABS
ORAL_TABLET | ORAL | Status: AC
  Filled 2014-09-20: qty 1

## 2014-09-20 MED ORDER — ROCURONIUM BROMIDE 50 MG/5ML IV SOLN
INTRAVENOUS | Status: AC
  Filled 2014-09-20: qty 1

## 2014-09-20 MED ORDER — GLYCOPYRROLATE 0.2 MG/ML IJ SOLN
INTRAMUSCULAR | Status: DC | PRN
  Administered 2014-09-20: 0.6 mg via INTRAVENOUS
  Administered 2014-09-20: 0.2 mg via INTRAVENOUS

## 2014-09-20 MED ORDER — DEXAMETHASONE SODIUM PHOSPHATE 4 MG/ML IJ SOLN
INTRAMUSCULAR | Status: AC
  Filled 2014-09-20: qty 1

## 2014-09-20 MED ORDER — THROMBIN 20000 UNITS EX KIT: PACK | CUTANEOUS | Status: DC | PRN

## 2014-09-20 MED ORDER — DEXAMETHASONE 4 MG PO TABS
4.0000 mg | ORAL_TABLET | Freq: Four times a day (QID) | ORAL | Status: AC
  Administered 2014-09-20 (×3): 4 mg via ORAL
  Filled 2014-09-20 (×3): qty 1

## 2014-09-20 MED ORDER — ONDANSETRON HCL 4 MG/2ML IJ SOLN
INTRAMUSCULAR | Status: DC | PRN
  Administered 2014-09-20: 4 mg via INTRAVENOUS

## 2014-09-20 MED ORDER — PROMETHAZINE HCL 25 MG/ML IJ SOLN: 6.2500 mg | INTRAMUSCULAR | Status: DC | PRN

## 2014-09-20 MED ORDER — DEXAMETHASONE SODIUM PHOSPHATE 10 MG/ML IJ SOLN
INTRAMUSCULAR | Status: AC
  Filled 2014-09-20: qty 1

## 2014-09-20 MED ORDER — HYDROMORPHONE HCL 1 MG/ML IJ SOLN
0.2500 mg | INTRAMUSCULAR | Status: DC | PRN
  Administered 2014-09-20 (×2): 0.5 mg via INTRAVENOUS

## 2014-09-20 MED ORDER — LACTATED RINGERS IV SOLN: INTRAVENOUS | Status: DC

## 2014-09-20 MED ORDER — ROCURONIUM BROMIDE 100 MG/10ML IV SOLN
INTRAVENOUS | Status: DC | PRN
  Administered 2014-09-20: 10 mg via INTRAVENOUS
  Administered 2014-09-20: 40 mg via INTRAVENOUS

## 2014-09-20 MED ORDER — 0.9 % SODIUM CHLORIDE (POUR BTL) OPTIME
TOPICAL | Status: DC | PRN
  Administered 2014-09-20: 1000 mL

## 2014-09-20 MED ORDER — MIDAZOLAM HCL 2 MG/2ML IJ SOLN
INTRAMUSCULAR | Status: AC
  Filled 2014-09-20: qty 4

## 2014-09-20 MED ORDER — LIDOCAINE HCL (CARDIAC) 20 MG/ML IV SOLN
INTRAVENOUS | Status: DC | PRN
  Administered 2014-09-20: 100 mg via INTRAVENOUS

## 2014-09-20 MED ORDER — PROPOFOL 10 MG/ML IV BOLUS
INTRAVENOUS | Status: AC
  Filled 2014-09-20: qty 20

## 2014-09-20 MED ORDER — FENTANYL CITRATE (PF) 250 MCG/5ML IJ SOLN
INTRAMUSCULAR | Status: AC
  Filled 2014-09-20: qty 5

## 2014-09-20 MED ORDER — DEXAMETHASONE SODIUM PHOSPHATE 4 MG/ML IJ SOLN: 4.0000 mg | Freq: Four times a day (QID) | INTRAMUSCULAR | Status: AC

## 2014-09-20 MED ORDER — WARFARIN SODIUM 5 MG PO TABS
5.0000 mg | ORAL_TABLET | Freq: Every day | ORAL | Status: DC
  Administered 2014-09-20: 5 mg via ORAL
  Filled 2014-09-20 (×2): qty 1

## 2014-09-20 MED ORDER — METHOCARBAMOL 500 MG PO TABS
500.0000 mg | ORAL_TABLET | Freq: Four times a day (QID) | ORAL | Status: DC | PRN
  Administered 2014-09-20 – 2014-09-21 (×5): 500 mg via ORAL
  Filled 2014-09-20 (×4): qty 1

## 2014-09-20 MED ORDER — BUPIVACAINE-EPINEPHRINE (PF) 0.25% -1:200000 IJ SOLN
INTRAMUSCULAR | Status: AC
  Filled 2014-09-20: qty 30

## 2014-09-20 MED ORDER — MENTHOL 3 MG MT LOZG
1.0000 | LOZENGE | OROMUCOSAL | Status: DC | PRN
  Filled 2014-09-20: qty 9

## 2014-09-20 MED ORDER — HEMOSTATIC AGENTS (NO CHARGE) OPTIME
TOPICAL | Status: DC | PRN
  Administered 2014-09-20: 1 via TOPICAL

## 2014-09-20 MED ORDER — EPHEDRINE SULFATE 50 MG/ML IJ SOLN
INTRAMUSCULAR | Status: AC
  Filled 2014-09-20: qty 1

## 2014-09-20 MED ORDER — EPHEDRINE SULFATE 50 MG/ML IJ SOLN
INTRAMUSCULAR | Status: DC | PRN
Start: 2014-09-20 — End: 2014-09-20
  Administered 2014-09-20: 10 mg via INTRAVENOUS
  Administered 2014-09-20: 5 mg via INTRAVENOUS
  Administered 2014-09-20 (×2): 10 mg via INTRAVENOUS
  Administered 2014-09-20: 5 mg via INTRAVENOUS

## 2014-09-20 MED ORDER — SCOPOLAMINE 1 MG/3DAYS TD PT72
MEDICATED_PATCH | TRANSDERMAL | Status: DC
Start: 2014-09-20 — End: 2014-09-21
  Administered 2014-09-20: 1.5 mg via TRANSDERMAL
  Filled 2014-09-20: qty 1

## 2014-09-20 MED ORDER — CEFAZOLIN SODIUM 1-5 GM-% IV SOLN
1.0000 g | Freq: Three times a day (TID) | INTRAVENOUS | Status: AC
  Administered 2014-09-20 (×2): 1 g via INTRAVENOUS
  Filled 2014-09-20 (×2): qty 50

## 2014-09-20 MED ORDER — GLYCOPYRROLATE 0.2 MG/ML IJ SOLN
INTRAMUSCULAR | Status: AC
  Filled 2014-09-20: qty 3

## 2014-09-20 MED ORDER — LEVOTHYROXINE SODIUM 175 MCG PO TABS
175.0000 ug | ORAL_TABLET | Freq: Every day | ORAL | Status: DC
  Administered 2014-09-21: 175 ug via ORAL
  Filled 2014-09-20 (×2): qty 1

## 2014-09-20 MED ORDER — OXYCODONE-ACETAMINOPHEN 10-325 MG PO TABS: 1.0000 | ORAL_TABLET | ORAL | Status: DC | PRN

## 2014-09-20 MED ORDER — PHENYLEPHRINE 40 MCG/ML (10ML) SYRINGE FOR IV PUSH (FOR BLOOD PRESSURE SUPPORT)
PREFILLED_SYRINGE | INTRAVENOUS | Status: AC
  Filled 2014-09-20: qty 10

## 2014-09-20 MED ORDER — NEOSTIGMINE METHYLSULFATE 10 MG/10ML IV SOLN
INTRAVENOUS | Status: AC
  Filled 2014-09-20: qty 1

## 2014-09-20 MED ORDER — LACTATED RINGERS IV SOLN
INTRAVENOUS | Status: DC | PRN
  Administered 2014-09-20 (×2): via INTRAVENOUS

## 2014-09-20 MED ORDER — WARFARIN - PHYSICIAN DOSING INPATIENT: Freq: Every day | Status: DC

## 2014-09-20 MED ORDER — PROPOFOL 10 MG/ML IV BOLUS
INTRAVENOUS | Status: DC | PRN
  Administered 2014-09-20: 100 mg via INTRAVENOUS

## 2014-09-20 MED ORDER — MIDAZOLAM HCL 5 MG/5ML IJ SOLN
INTRAMUSCULAR | Status: DC | PRN
  Administered 2014-09-20: 2 mg via INTRAVENOUS

## 2014-09-20 MED ORDER — LACTATED RINGERS IV SOLN
INTRAVENOUS | Status: DC | PRN
  Administered 2014-09-20: 08:00:00 via INTRAVENOUS

## 2014-09-20 MED ORDER — MEPERIDINE HCL 25 MG/ML IJ SOLN: 6.2500 mg | INTRAMUSCULAR | Status: DC | PRN

## 2014-09-20 MED ORDER — GELATIN ABSORBABLE 100 EX MISC
CUTANEOUS | Status: DC | PRN
  Administered 2014-09-20: 20 mL via TOPICAL

## 2014-09-20 MED ORDER — FENTANYL CITRATE (PF) 100 MCG/2ML IJ SOLN
INTRAMUSCULAR | Status: DC | PRN
  Administered 2014-09-20 (×2): 50 ug via INTRAVENOUS
  Administered 2014-09-20: 100 ug via INTRAVENOUS
  Administered 2014-09-20: 50 ug via INTRAVENOUS

## 2014-09-20 MED ORDER — METHOCARBAMOL 1000 MG/10ML IJ SOLN
500.0000 mg | Freq: Four times a day (QID) | INTRAMUSCULAR | Status: DC | PRN
  Filled 2014-09-20: qty 5

## 2014-09-20 MED ORDER — NEOSTIGMINE METHYLSULFATE 10 MG/10ML IV SOLN
INTRAVENOUS | Status: DC | PRN
  Administered 2014-09-20: 4 mg via INTRAVENOUS

## 2014-09-20 SURGICAL SUPPLY — 65 items
ADH SKN CLS APL DERMABOND .7 (GAUZE/BANDAGES/DRESSINGS) ×1
BIT MILLING PRODISC 2.0 STER (BIT) ×1 IMPLANT
BLADE SURG ROTATE 9660 (MISCELLANEOUS) IMPLANT
CANISTER SUCTION 2500CC (MISCELLANEOUS) ×2 IMPLANT
CLSR STERI-STRIP ANTIMIC 1/2X4 (GAUZE/BANDAGES/DRESSINGS) ×1 IMPLANT
COLLAR CERV LO CONTOUR FIRM DE (SOFTGOODS) ×1 IMPLANT
CORDS BIPOLAR (ELECTRODE) ×2 IMPLANT
COVER MAYO STAND STRL (DRAPES) ×2 IMPLANT
COVER SURGICAL LIGHT HANDLE (MISCELLANEOUS) ×4 IMPLANT
CRADLE DONUT ADULT HEAD (MISCELLANEOUS) ×2 IMPLANT
DERMABOND ADVANCED (GAUZE/BANDAGES/DRESSINGS) ×1
DERMABOND ADVANCED .7 DNX12 (GAUZE/BANDAGES/DRESSINGS) ×1 IMPLANT
DISC PRODISC-C MED DEEP 5MM (Neuro Prosthesis/Implant) ×1 IMPLANT
DRAPE C-ARM 42X72 X-RAY (DRAPES) ×3 IMPLANT
DRAPE POUCH INSTRU U-SHP 10X18 (DRAPES) ×2 IMPLANT
DRAPE SURG 17X23 STRL (DRAPES) ×2 IMPLANT
DRAPE U-SHAPE 47X51 STRL (DRAPES) ×2 IMPLANT
DRSG MEPILEX BORDER 4X4 (GAUZE/BANDAGES/DRESSINGS) ×2 IMPLANT
DURAPREP 26ML APPLICATOR (WOUND CARE) ×2 IMPLANT
ELECT COATED BLADE 2.86 ST (ELECTRODE) ×2 IMPLANT
ELECT PENCIL ROCKER SW 15FT (MISCELLANEOUS) ×2 IMPLANT
ELECT REM PT RETURN 9FT ADLT (ELECTROSURGICAL) ×2
ELECTRODE REM PT RTRN 9FT ADLT (ELECTROSURGICAL) ×1 IMPLANT
GLOVE BIOGEL PI IND STRL 8 (GLOVE) ×1 IMPLANT
GLOVE BIOGEL PI IND STRL 8.5 (GLOVE) ×1 IMPLANT
GLOVE BIOGEL PI INDICATOR 8 (GLOVE) ×1
GLOVE BIOGEL PI INDICATOR 8.5 (GLOVE) ×1
GLOVE ORTHO TXT STRL SZ7.5 (GLOVE) ×2 IMPLANT
GLOVE SURG SS PI 6.5 STRL IVOR (GLOVE) ×2 IMPLANT
GLOVE SURG SS PI 7.0 STRL IVOR (GLOVE) ×2 IMPLANT
GLOVE SURG SS PI 8.5 STRL IVOR (GLOVE) ×1
GLOVE SURG SS PI 8.5 STRL STRW (GLOVE) IMPLANT
GOWN STRL REUS W/ TWL LRG LVL3 (GOWN DISPOSABLE) ×1 IMPLANT
GOWN STRL REUS W/TWL 2XL LVL3 (GOWN DISPOSABLE) ×4 IMPLANT
GOWN STRL REUS W/TWL LRG LVL3 (GOWN DISPOSABLE) ×4
KIT BASIN OR (CUSTOM PROCEDURE TRAY) ×2 IMPLANT
KIT ROOM TURNOVER OR (KITS) ×2 IMPLANT
NDL SPNL 18GX3.5 QUINCKE PK (NEEDLE) ×1 IMPLANT
NEEDLE SPNL 18GX3.5 QUINCKE PK (NEEDLE) ×2 IMPLANT
NS IRRIG 1000ML POUR BTL (IV SOLUTION) ×2 IMPLANT
PACK ORTHO CERVICAL (CUSTOM PROCEDURE TRAY) ×2 IMPLANT
PACK UNIVERSAL I (CUSTOM PROCEDURE TRAY) ×2 IMPLANT
PAD ARMBOARD 7.5X6 YLW CONV (MISCELLANEOUS) ×4 IMPLANT
PIN RETAINER PRODISC 14 MM (PIN) ×2 IMPLANT
Pro-Disc C Inserter Tip Medium & Medium 5mm ×1 IMPLANT
RESTRAINT LIMB HOLDER UNIV (RESTRAINTS) ×1 IMPLANT
SPONGE INTESTINAL PEANUT (DISPOSABLE) ×1 IMPLANT
SPONGE SURGIFOAM ABS GEL 100 (HEMOSTASIS) ×2 IMPLANT
STRIP CLOSURE SKIN 1/2X4 (GAUZE/BANDAGES/DRESSINGS) ×2 IMPLANT
SURGIFLO W/THROMBIN 8M KIT (HEMOSTASIS) IMPLANT
SUT BONE WAX W31G (SUTURE) ×2 IMPLANT
SUT MNCRL AB 3-0 PS2 27 (SUTURE) ×1 IMPLANT
SUT MON AB 3-0 SH 27 (SUTURE) ×2
SUT MON AB 3-0 SH27 (SUTURE) ×1 IMPLANT
SUT SILK 3 0 TIES 17X18 (SUTURE) ×2
SUT SILK 3-0 18XBRD TIE BLK (SUTURE) ×1 IMPLANT
SUT VIC AB 2-0 CT1 18 (SUTURE) ×2 IMPLANT
SYR BULB IRRIGATION 50ML (SYRINGE) ×2 IMPLANT
SYR CONTROL 10ML LL (SYRINGE) IMPLANT
TAPE CLOTH 4X10 WHT NS (GAUZE/BANDAGES/DRESSINGS) ×2 IMPLANT
TAPE UMBILICAL COTTON 1/8X30 (MISCELLANEOUS) ×2 IMPLANT
TIP INSERTER MEDIUM (INSTRUMENTS) ×1 IMPLANT
TOWEL OR 17X24 6PK STRL BLUE (TOWEL DISPOSABLE) ×2 IMPLANT
TOWEL OR 17X26 10 PK STRL BLUE (TOWEL DISPOSABLE) ×2 IMPLANT
WATER STERILE IRR 1000ML POUR (IV SOLUTION) ×2 IMPLANT

## 2014-09-20 NOTE — Discharge Instructions (Signed)

## 2014-09-20 NOTE — Transfer of Care (Signed)
Immediate Anesthesia Transfer of Care Note  Patient: Laura Molina  Procedure(s) Performed: Procedure(s): TOTAL DISC REPLACEMENT C5-6 (N/A)  Patient Location: PACU  Anesthesia Type:General  Level of Consciousness: awake, alert , patient cooperative and responds to stimulation  Airway & Oxygen Therapy: Patient Spontanous Breathing and Patient connected to face mask oxygen  Post-op Assessment: Report given to RN, Post -op Vital signs reviewed and stable and Patient moving all extremities X 4  Post vital signs: Reviewed and stable  Last Vitals:  Filed Vitals:   09/20/14 0603  BP: 145/79  Pulse: 55  Temp: 36.6 C  Resp: 20    Complications: No apparent anesthesia complications

## 2014-09-20 NOTE — Anesthesia Postprocedure Evaluation (Signed)
  Anesthesia Post-op Note  Patient: Laura Molina  Procedure(s) Performed: Procedure(s): TOTAL DISC REPLACEMENT C5-6 (N/A)  Patient Location: PACU  Anesthesia Type:General  Level of Consciousness: awake and oriented  Airway and Oxygen Therapy: Patient Spontanous Breathing  Post-op Pain: mild  Post-op Assessment: Post-op Vital signs reviewed, Patient's Cardiovascular Status Stable, Respiratory Function Stable, Patent Airway and No signs of Nausea or vomiting LLE Motor Response: Purposeful movement, Responds to commands LLE Sensation: Full sensation, No numbness, No tingling RLE Motor Response: Purposeful movement, Responds to commands RLE Sensation: Full sensation, No numbness, No tingling      Post-op Vital Signs: Reviewed and stable  Last Vitals:  Filed Vitals:   09/20/14 1118  BP: 124/66  Pulse: 52  Temp:   Resp: 6    Complications: No apparent anesthesia complications

## 2014-09-20 NOTE — H&P (Signed)
History of Present Illness  The patient is a 54 year old female who presents with neck pain. The patient is seen today in referral from Dr Nelva Bush (Who saw her as a second opinion.). The patient reports symptoms involving the entire neck which began 14 year(s) ago. The symptoms began without any known injury. Symptoms include neck pain (posteriorly as well as, the upper thoracic and into the trapezii. "I have now started having pain in the lower back now"). The pain radiates to the left arm and right arm. The patient describes the pain as aching.The patient describes their symptoms as moderate in severity (rating pain at 6/10 right now).The patient does feel that the symptoms are worsening. Symptoms are exacerbated by turning the head to the right, turning the head to the left and neck extension, while symptoms are not exacerbated by neck flexion. Associated symptoms include headache and upper extremity paresthesias (bilaterally "I have suffered with my arms for 14 years"). Pertinent medical history does not include neck pain, spinal surgery, low back pain, cervical disc herniation, neck injury or cervical spondylosis. Prior to being seen today the patient was previously evaluated by a colleague (Dr Nelva Bush "he did not do any injections, he wanted to wait until Dr Rolena Infante saw me"). Past evaluation has included nerve conduction velocity studies (of the arms that was done last year at Dr Jeral Fruit). The patient states that this is not a Designer, jewellery case. She said her pain began increasing in May 2016.   Additional reasons for visit:  Transition into care is described as the following: The patient is transitioning into care from another physician (Dr Suella Broad has referred pt for neck pain) .  Allergies  Ibuprofen *ANALGESICS - ANTI-INFLAMMATORY*  Family History First Degree Relatives reported Heart Disease Father, Maternal Grandfather, Mother. Heart disease in female family member before  age 18 Heart disease in female family member before age 52 Hypertension Brother, Father. Bleeding disorder Brother. Congestive Heart Failure Maternal Grandfather. Liver Disease, Chronic Mother. Diabetes Mellitus Father, Maternal Grandfather, Maternal Grandmother, Mother.  Social History  Tobacco use Former smoker. 06/21/2014: smoke(d) 2 pack(s) per day uses less than 1/2 can(s) smokeless per week Tobacco / smoke exposure 06/21/2014: no Number of flights of stairs before winded 2-3 No history of drug/alcohol rehab Not under pain contract Children 4 Exercise Exercises daily; does running / walking Living situation live with spouse Never consumed alcohol 06/21/2014: Never consumed alcohol Current work status working full time Marital status married  Medication History Synthroid (175MCG Tablet, Oral) Active. Coumadin (5MG  Tablet, Oral) Active. Medications Reconciled   Physical Exam General General Appearance-Not in acute distress. Orientation-Oriented X3. Build & Nutrition-Well nourished and Well developed.  Integumentary General Characteristics Surgical Scars - no surgical scar evidence of previous cervical surgery. Cervical Spine-Skin examination of the cervical spine is without deformity, skin lesions, lacerations or abrasions.  Peripheral Vascular Upper Extremity Palpation - Radial pulse - Bilateral - 2+.  Neurologic Sensation Upper Extremity - Bilateral - sensation is intact in the upper extremity. Reflexes Biceps Reflex - Bilateral - 2+. Brachioradialis Reflex - Bilateral - 2+. Triceps Reflex - Bilateral - 2+. Hoffman's Sign - Bilateral - Hoffman's sign not present.  Musculoskeletal Spine/Ribs/Pelvis  Cervical Spine : Inspection and Palpation - Tenderness - no soft tissue tenderness to palpation and no bony tenderness to palpation, bony/soft tissue palpation of the cervical spine and shoulders does not recreate their typical  pain. Strength and Tone: Strength: Strength: Strength - Deltoid - Bilateral - 5/5. Biceps -  Bilateral - 5/5. Right - 4-/5. Triceps - Left - 5/5. Wrist Extension - Bilateral - 5/5. Hand Grip - Bilateral - 5/5. Heel walk - Bilateral - able to heel walk without difficulty. Toe Walk - Bilateral - able to walk on toes without difficulty. Heel-Toe Walk - Bilateral - able to heel-toe walk without difficulty. ROM - Flexion - Full. Extension - Mildly decreased and painful. Left Lateral Flexion - Moderately Decreased and painful. Right Lateral Flexion - Mildly decreased and painful. Left Rotation - Mildly decreased and painful. Right Rotation - Mildly decreased and painful. Pain - extension is more painful than flexion. Cervical Spine - Special Testing - axial compression test negative, cross chest impingement test negative. Non-Anatomic Signs - No non-anatomic signs present. Upper Extremity Range of Motion - No truesholder pain with IR/ER of the shoulders.  Objective Transcription MRI was reviewed. 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 Minor degenerative changes in the remainder of the cervical spine.  Assessment & Plan  At this point in time, she has had chronic debilitating neck and bilateral arm pain. She states there has been an acute increase in her neck pain over the last three months.  Plans Transcription At this point in time, we did discuss surgical intervention. It would be a single level anterior cervical discectomy and fusion verses possible cervical total disc replacement. I have given her some literature on the disc replacement and she will go to the [employer], our [place] web page for some more literature on the anterior cervical discectomy and fusion. We have talked about epidural steroid injections and I am not confident that they will significantly positively impact her quality  of life. She also states that after speaking with Dr. Nelva Bush that he also feels the same way. We have gone over the risks of surgery which include infection, bleeding, nerve damage, death, stroke, paralysis, failure to heal, need for further surgery, ongoing or worse pain, loss of bowel and bladder control, need for posterior surgery. If we proceed with a total disc replacement, there is a risk that technically, for some reason, the disc was not fitting properly that we bail out to effusion. All of her questions were encouraged and addressed. She is present for the dictation.

## 2014-09-20 NOTE — Brief Op Note (Signed)
09/20/2014  10:12 AM  PATIENT:  Laura Molina  54 y.o. 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

## 2014-09-20 NOTE — Anesthesia Procedure Notes (Signed)
Procedure Name: Intubation Date/Time: 09/20/2014 7:47 AM Performed by: Gershon Mussel, Chaos Carlile Pre-anesthesia Checklist: Patient identified, Patient being monitored, Timeout performed, Emergency Drugs available and Suction available Patient Re-evaluated:Patient Re-evaluated prior to inductionOxygen Delivery Method: Circle System Utilized Preoxygenation: Pre-oxygenation with 100% oxygen Intubation Type: IV induction Ventilation: Mask ventilation without difficulty Laryngoscope Size: Miller and 2 Grade View: Grade I Tube type: Oral Tube size: 7.0 mm Number of attempts: 1 Airway Equipment and Method: Stylet Placement Confirmation: ETT inserted through vocal cords under direct vision,  positive ETCO2 and breath sounds checked- equal and bilateral Secured at: 21 cm Tube secured with: Tape Dental Injury: Teeth and Oropharynx as per pre-operative assessment

## 2014-09-21 ENCOUNTER — Encounter (HOSPITAL_COMMUNITY): Payer: Self-pay | Admitting: Orthopedic Surgery

## 2014-09-21 DIAGNOSIS — M5012 Cervical disc disorder with radiculopathy, mid-cervical region: Secondary | ICD-10-CM | POA: Diagnosis not present

## 2014-09-21 LAB — CBC
HCT: 37.2 % (ref 36.0–46.0)
HEMOGLOBIN: 12.4 g/dL (ref 12.0–15.0)
MCH: 27.4 pg (ref 26.0–34.0)
MCHC: 33.3 g/dL (ref 30.0–36.0)
MCV: 82.3 fL (ref 78.0–100.0)
Platelets: 242 10*3/uL (ref 150–400)
RBC: 4.52 MIL/uL (ref 3.87–5.11)
RDW: 13.7 % (ref 11.5–15.5)
WBC: 8.7 10*3/uL (ref 4.0–10.5)

## 2014-09-21 LAB — PROTIME-INR
INR: 1.2 (ref 0.00–1.49)
PROTHROMBIN TIME: 15.4 s — AB (ref 11.6–15.2)

## 2014-09-21 MED FILL — Thrombin For Soln 20000 Unit: CUTANEOUS | Qty: 1 | Status: AC

## 2014-09-21 NOTE — Evaluation (Signed)
Occupational Therapy Evaluation Patient Details Name: Laura Molina MRN: [id number] DOB: January 21, 1960 Today's Date: 09/21/2014    History of Present Illness Pt is a 54 y/o female who was admitted s/p C5-6 total disk arthroplasty on 09/20/14.   Clinical Impression   Pt admitted to hospital due to reason stated above. Pt currently with functional limitiations due to cervical neck precautions. Pt prior to admission pt was independent with all ADLs, however, c/o of bilateral UE hand weakness. Pt currently requires supervision with ADLs/IADLs to maintain safety as well maintain cervical neck precautions. Pt was educated on cervical neck precautions and provided a handout to reinforce understanding of pt's precautions related to pt's ADL/IADLs. Therapist recommended supervision from family member for transfers in/out of tub for increase safety as well as using hand-held shower head to assist with bathing. Pt will benefit from skilled OT once cleared by MD to increase her independence and safety with ADLs/IADLs and maintaining cervical neck precautions to allow discharge to venue listed below.    Follow Up Recommendations  Outpatient OT;Supervision - Intermittent (when appropriate according to post-op protocol)    Equipment Recommendations  None recommended by OT    Recommendations for Other Services       Precautions / Restrictions Precautions Precautions: Fall Required Braces or Orthoses: Cervical Brace Cervical Brace: Soft collar Restrictions Weight Bearing Restrictions: No      Mobility Bed Mobility Overal bed mobility: Needs Assistance Bed Mobility: Sit to Sidelying;Rolling Rolling: Supervision       Sit to sidelying: Supervision General bed mobility comments: Pt required min verbal cues in order to perform log roll technique  Transfers Overall transfer level: Needs assistance Equipment used: None Transfers: Sit to/from Stand Sit to Stand: Supervision         General  transfer comment: Supervision for safety    Balance Overall balance assessment: Needs assistance Sitting-balance support: Feet supported;No upper extremity supported Sitting balance-Leahy Scale: Fair     Standing balance support: No upper extremity supported;During functional activity Standing balance-Leahy Scale: Fair                              ADL Overall ADL's : Needs assistance/impaired Eating/Feeding: Independent;Set up;Sitting         Upper Body Bathing Details (indicate cue type and reason): utilization of pt's hand-held shower head   Lower Body Bathing Details (indicate cue type and reason): Therapist recommended pt use long handle sponge to maintain cervical neck precautions                 Tub/ Shower Transfer: Min guard;Cueing for safety;Ambulation Tub/Shower Transfer Details (indicate cue type and reason):  (recommended pt utilize grab bar to assist with transfer)   General ADL Comments: Pt was able to position leg onto opposite leg in order to don/doff socks. Therapist recommended utilization of reacher, long handle sponge and her hand held shower head.     Vision     Perception     Praxis      Pertinent Vitals/Pain Pain Assessment: 0-10 Pain Score: 6  Pain Location: Incisional pain Pain Descriptors / Indicators: Operative site guarding Pain Intervention(s): Limited activity within patient's tolerance;Monitored during session;Repositioned     Hand Dominance Right   Extremity/Trunk Assessment Upper Extremity Assessment Upper Extremity Assessment: Defer to OT evaluation   Lower Extremity Assessment Lower Extremity Assessment: Overall WFL for tasks assessed   Cervical / Trunk Assessment Cervical / Trunk Assessment: Normal  Communication Communication Communication: No difficulties   Cognition Arousal/Alertness: Awake/alert Behavior During Therapy: WFL for tasks assessed/performed Overall Cognitive Status: Within Functional  Limits for tasks assessed                     General Comments       Exercises       Shoulder Instructions      Home Living Family/patient expects to be discharged to:: Private residence Living Arrangements: Spouse/significant other Available Help at Discharge: Family;Available 24 hours/day Type of Home: House Home Access: Stairs to enter [employer] of Steps: 4 Entrance Stairs-Rails: None Home Layout: One level           Bathroom Accessibility: Yes   Home Equipment: None          Prior Functioning/Environment Level of Independence: Independent        Comments: Pt states she was not able to work, and if she bathed/dressed/cleaned/cooked, she was pretty much done for the day it would wear her out so much (because of pain).     OT Diagnosis: Generalized weakness;Acute pain (Pt c/o bilateral hand weakness )   OT Problem List:     OT Treatment/Interventions:      OT Goals(Current goals can be found in the care plan section) Acute Rehab OT Goals Patient Stated Goal: Return home, get back to PLOF  OT Frequency:     Barriers to D/C:            Co-evaluation              End of Session Equipment Utilized During Treatment: Gait belt;Cervical collar  Activity Tolerance: Patient tolerated treatment well Patient left: in bed;with call bell/phone within reach;with family/visitor present   Time: 9191-6606 OT Time Calculation (min): 34 min Charges:  OT General Charges $OT Visit: 1 Procedure OT Evaluation $Initial OT Evaluation Tier I: 1 Procedure OT Treatments $Self Care/Home Management : 8-22 mins G-Codes: OT G-codes **NOT FOR INPATIENT CLASS** Functional Assessment Tool Used: clinical judgement Functional Limitation: Self care Self Care Current Status (Y0459): At least 1 percent but less than 20 percent impaired, limited or restricted Self Care Goal Status (X7741): At least 1 percent but less than 20 percent impaired, limited or  restricted Self Care Discharge Status [phone]): At least 1 percent but less than 20 percent impaired, limited or restricted  Lin Landsman 09/21/2014, 1:58 PM Kendal Hymen OTS 09/21/2014 1:58 PM

## 2014-09-21 NOTE — Progress Notes (Signed)
    Subjective: Procedure(s) (LRB): TOTAL DISC REPLACEMENT C5-6 (N/A) 1 Day Post-Op  Patient reports pain as 2 on 0-10 scale.  Reports decreased arm pain reports incisional neck pain   Positive void Negative bowel movement Positive flatus Negative chest pain or shortness of breath  Objective: Vital signs in last 24 hours: Temp:  [97.5 F (36.4 C)-98.6 F (37 C)] 98.6 F (37 C) (09/02 0400) Pulse Rate:  [50-74] 74 (09/02 0400) Resp:  [6-18] 18 (09/02 0400) BP: (102-141)/(56-76) 112/75 mmHg (09/02 0400) SpO2:  [95 %-100 %] 99 % (09/02 0400)  Intake/Output from previous day: 09/01 0701 - 09/02 0700 In: 1910 [P.O.:60; I.V.:1850] Out: -   Labs:  Recent Labs  09/21/14 0448  WBC 8.7  RBC 4.52  HCT 37.2  PLT 242   No results for input(s): NA, K, CL, CO2, BUN, CREATININE, GLUCOSE, CALCIUM in the last 72 hours.  Recent Labs  09/20/14 0620 09/21/14 0448  INR 1.10 1.20    Physical Exam: Neurologically intact ABD soft Neurovascular intact Intact pulses distally Incision: dressing C/D/I Compartment soft  Assessment/Plan: Patient stable  xrays satisfactory Mobilization with physical therapy Encourage incentive spirometry Continue care  Advance diet Up with therapy  Plan on d/c to home today  Melina Schools, MD [place] [phone]

## 2014-09-21 NOTE — Progress Notes (Signed)
Pt doing well. Pt and husband given D/C instructions with Rx's, verbal understanding was provided. Pt's incision is clean and dry with no sign of infection. Pt's IV was removed prior to D/C. Pt D/C'd home via wheelchair @ 1255 per MD order. Pt is stable @ D/C and has no other needs at this time. Holli Humbles, RN

## 2014-09-21 NOTE — Op Note (Signed)
NAMEBRIANA, FARNER NO.:  [id number]  MEDICAL RECORD NO.:  [id number]  LOCATION:  3C07C                        FACILITY:  [facility]  PHYSICIAN:  Wessley Emert D. Rolena Infante, M.D. DATE OF BIRTH:  10-06-1960  DATE OF PROCEDURE:  09/20/2014 DATE OF DISCHARGE:                              OPERATIVE REPORT   PREOPERATIVE DIAGNOSIS:  Cervical disk herniation, C5-C6 with radiculopathy.  POSTOPERATIVE DIAGNOSIS:  Cervical disk herniation, C5-C6 with radiculopathy.  OPERATIVE PROCEDURE:  Total disk arthroplasty, C5-C6.  SURGEON:  Lenoard Helbert D. Rolena Infante, M.D.  FIRST ASSISTANT:  Ronette Deter, [place].  INSTRUMENTATION USED:  DePuy ProDisc-C size 5 medium.  COMPLICATIONS:  None.  CONDITION:  Stable.  HISTORY:  This is a very pleasant young woman who had a work-related injury and has been complaining of severe neck and radicular arm pain. Imaging studies confirmed a disk herniation at C5-C6.  After discussing treatment options, she elected to proceed with surgery.  All appropriate risks, benefits, and alternatives were discussed with the patient. Consent was obtained.  OPERATIVE NOTE:  The patient was brought to the operating room, placed supine on the operating table.  After successful induction of general anesthesia and endotracheal intubation, TEDs, SCDs were applied.  An inflatable bag was placed underneath the scapula, and the anterior cervical spine was prepped and draped in a standard fashion.  A time-out was taken to confirm patient, procedure, and all other pertinent important data.  I inflated the bag to recreate the lordosis and took an x-ray to confirm the C5-C6 level.  I infiltrated the incision site with 0.25% Marcaine and then made an incision, starting at the midline going to the left.  I performed a standard Smith-Robinson approach to the anterior cervical spine.  I divided the platysma and bluntly dissected down the deep cervical fascia along the medial border  of sternocleidomastoid.  The omohyoid was left in place as it was not obstructing my view.  Once I was through the prevertebral fascia, I placed a needle into the C5-C6 disc space, took an x-ray to confirm I was at the appropriate level.  Once this was confirmed, I used bipolar electrocautery to mobilize the longus colli muscles at the disk space level bilaterally.  I then placed a 35 mm retracting blades underneath the longus colli muscle, deflated the endotracheal cuff, expanded the retractors, and reinflated the cuff.  I could see from uncovertebral joint to uncovertebral joint.  An annulotomy was performed with a 15 blade scalpel; then, using a combination of pituitary rongeurs and curettes, I removed the bulk of the disk material.  I then used a 2 mm Kerrison to trim down the anterior overhanging osteophyte from the inferior aspect of the C5 vertebral body.  I then under AP visualization identified the midline of the cervical body and placed distraction pins parallel to the endplates. I then distracted the intervertebral space and maintained the distraction with the pins.  I continued to work posteriorly removing the disk and annulus.  Using a fine nerve hook, I was able to deliver three large fragments of disk herniation, consistent with what we seen on the MRI.  Once they were delivered, I could now use my nerve hook  to develop a plane underneath the posterior longitudinal ligament.  I then released the posterior longitudinal ligament with a 1 mm Kerrison rongeur.  This allowed me to trim down the posterior osteophytes from the body of C5 and C6.  At this point, I had an excellent decompression and diskectomy. I made sure I had removed all the cartilaginous material from the endplates and then measured with trial devices.  I elected to use a 5 medium implant.  This was a 15 mm of width with 14 mm in depth.  I had good fill from uncovertebral joint to uncovertebral joint.  Once it  was secured into position and confirmed midline, I then used the reamer to ream out the thin cuts.  I then obtained the implant and malleted to the appropriate depth.  I then irrigated the wound copiously with normal saline and removed the distraction pins and placed bone wax in the holes.  I then used bone wax plus Bovie and FloSeal to obtain hemostasis.  Once I had complete hemostasis and there was no active bleeding, I irrigated the wound copiously with normal saline.  I returned the trachea and esophagus to midline, and then, took final AP and lateral views; and I also had a flexion view.  The device itself was well seated.  It was not overstuffing the compartment; there was no distraction of the facet joints.  It was also midline on the AP view.  I then closed the platysma with interrupted 2-0 Vicryl sutures and a 3-0 Monocryl for the skin.  Steri-Strips and dry dressing were applied, and the patient was ultimately extubated, transferred to the PACU without incident.  At the end of the case, all needle and sponge counts were correct.     Fredick Schlosser D. Rolena Infante, M.D.     DDB/MEDQ  D:  09/20/2014  T:  09/20/2014  Job:  [id number]  cc:   [place]

## 2014-09-21 NOTE — Evaluation (Signed)
Physical Therapy Evaluation Patient Details Name: Laura Molina MRN: [id number] DOB: 12-23-1960 Today's Date: 09/21/2014   History of Present Illness  Pt is a 54 y/o female who was admitted s/p C5-6 total disk arthroplasty on 09/20/14.  Clinical Impression  Pt admitted with above diagnosis. Pt currently with functional limitations due to the deficits listed below (see PT Problem List). At the time of PT eval pt was able to perform transfers and ambulation with close guard for safety. Pt progressed throughout session to [facility] for slight balance deficits. Feel a SPC will be beneficial for community ambulation until back to baseline. Pt will benefit from skilled PT to increase their independence and safety with mobility to allow discharge to the venue listed below.       Follow Up Recommendations Outpatient PT;Supervision - Intermittent (When appropriate per post-op protocol)    Equipment Recommendations  Cane    Recommendations for Other Services       Precautions / Restrictions Precautions Precautions: Fall Required Braces or Orthoses: Cervical Brace Cervical Brace: Soft collar Restrictions Weight Bearing Restrictions: No      Mobility  Bed Mobility Overal bed mobility: Modified Independent             General bed mobility comments: VC's for log roll technique and to move slowly through transfers as pt has been getting dizzy.   Transfers Overall transfer level: Needs assistance Equipment used: None Transfers: Sit to/from Stand Sit to Stand: Supervision         General transfer comment: Supervision for safety  Ambulation/Gait Ambulation/Gait assistance: Supervision Ambulation Distance (Feet): 350 Feet Assistive device: Rolling walker (2 wheeled);None;1 person hand held assist Gait Pattern/deviations: Step-through pattern;Decreased stride length;Trunk flexed Gait velocity: Decreased Gait velocity interpretation: Below normal speed for age/gender General Gait  Details: Pt was able to mobilize in the room without AD. For ambulation in hall, used the RW initially and progressed to [facility]. Mild balance deficits noted.  Stairs Stairs: Yes Stairs assistance: Min guard Stair Management: Step to pattern (HHA forward ascending/descending) Number of Stairs: 4 General stair comments: Pt was cued for sequencing and general safety awareness.   Wheelchair Mobility    Modified Rankin (Stroke Patients Only)       Balance Overall balance assessment: Needs assistance Sitting-balance support: Feet supported;No upper extremity supported Sitting balance-Leahy Scale: Fair     Standing balance support: No upper extremity supported;During functional activity Standing balance-Leahy Scale: Fair                               Pertinent Vitals/Pain Pain Assessment: 0-10 Pain Score: 5  Pain Location: Incisional pain Pain Descriptors / Indicators: Operative site guarding Pain Intervention(s): Limited activity within patient's tolerance;Monitored during session;Repositioned    Home Living Family/patient expects to be discharged to:: Private residence Living Arrangements: Spouse/significant other Available Help at Discharge: Family;Available 24 hours/day Type of Home: House Home Access: Stairs to enter Entrance Stairs-Rails: None Entrance Stairs-Number of Steps: 4 Home Layout: One level Home Equipment: None      Prior Function Level of Independence: Independent         Comments: Pt states she was not able to work, and if she bathed/dressed/cleaned/cooked, she was pretty much done for the day it would wear her out so much (because of pain).      Hand Dominance   Dominant Hand: Right    Extremity/Trunk Assessment   Upper Extremity Assessment: Defer to OT evaluation  Lower Extremity Assessment: Overall WFL for tasks assessed      Cervical / Trunk Assessment: Normal  Communication   Communication: No difficulties   Cognition Arousal/Alertness: Awake/alert Behavior During Therapy: WFL for tasks assessed/performed Overall Cognitive Status: Within Functional Limits for tasks assessed                      General Comments      Exercises        Assessment/Plan    PT Assessment Patient needs continued PT services  PT Diagnosis Difficulty walking;Generalized weakness   PT Problem List Decreased strength;Decreased range of motion;Decreased balance;Decreased activity tolerance;Decreased mobility;Decreased knowledge of use of DME;Decreased safety awareness;Decreased knowledge of precautions;Pain  PT Treatment Interventions DME instruction;Gait training;Stair training;Functional mobility training;Therapeutic activities;Therapeutic exercise;Neuromuscular re-education;Patient/family education   PT Goals (Current goals can be found in the Care Plan section) Acute Rehab PT Goals Patient Stated Goal: Return home, get back to PLOF PT Goal Formulation: With patient Time For Goal Achievement: 09/28/14 Potential to Achieve Goals: Good    Frequency Min 5X/week   Barriers to discharge        Co-evaluation               End of Session Equipment Utilized During Treatment: Gait belt;Cervical collar Activity Tolerance: Patient tolerated treatment well Patient left: in chair;with call bell/phone within reach Nurse Communication: Mobility status    Functional Assessment Tool Used: Clinical judgement Functional Limitation: Mobility: Walking and moving around Mobility: Walking and Moving Around Current Status (Y4825): At least 1 percent but less than 20 percent impaired, limited or restricted Mobility: Walking and Moving Around Goal Status [phone]): At least 1 percent but less than 20 percent impaired, limited or restricted    Time: 0838-0904 PT Time Calculation (min) (ACUTE ONLY): 26 min   Charges:   PT Evaluation $Initial PT Evaluation Tier I: 1 Procedure PT Treatments $Gait Training:  8-22 mins   PT G Codes:   PT G-Codes **NOT FOR INPATIENT CLASS** Functional Assessment Tool Used: Clinical judgement Functional Limitation: Mobility: Walking and moving around Mobility: Walking and Moving Around Current Status (W8889): At least 1 percent but less than 20 percent impaired, limited or restricted Mobility: Walking and Moving Around Goal Status [phone]): At least 1 percent but less than 20 percent impaired, limited or restricted    Rolinda Roan 09/21/2014, 10:41 AM   Rolinda Roan, PT, DPT Acute Rehabilitation Services Pager: [phone]

## 2014-10-08 NOTE — Discharge Summary (Addendum)
Physician Discharge Summary  Patient ID: Laura Molina MRN: [id number] DOB/AGE: January 12, 1961 54 y.o.  Admit date: 09/20/2014 Discharge date: 10/08/2014  Admission Diagnoses:  <principal problem not specified>  Discharge Diagnoses:  Active Problems:   Neck pain   Past Medical History  Diagnosis Date  . Deep vein thrombosis (DVT)     several over the years  . Fibroid   . 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 Thyroid disease   . GERD (gastroesophageal reflux disease)   . Numbness in both hands     tendonitis  . Family history of anesthesia complication     family hx nausea and vomiting  . PONV (postoperative nausea and vomiting)     difficulty waking up   . Hypothyroidism   . Depression     not taking medicine   . Headache   . Dizziness     Surgeries: Procedure(s): TOTAL DISC REPLACEMENT C5-6 on 09/20/2014   Consultants (if any):    Discharged Condition: Improved  Hospital Course: Laura Molina is an 54 y.o. female who was admitted 09/20/2014 with a diagnosis of cervical and radicular arm pain and went to the operating room on 09/20/2014 and underwent the above named procedures.  Her hospital course was uneventful and she was discharged home on 09/21/14.  She was given perioperative antibiotics:  Anti-infectives    Start     Dose/Rate Route Frequency Ordered Stop   09/20/14 1600  ceFAZolin (ANCEF) IVPB 1 g/50 mL premix     1 g 100 mL/hr over 30 Minutes Intravenous Every 8 hours 09/20/14 1204 09/20/14 2334   09/20/14 0700  ceFAZolin (ANCEF) IVPB 2 g/50 mL premix     2 g 100 mL/hr over 30 Minutes Intravenous To ShortStay [facility] 09/19/14 1323 09/20/14 0745    .  She was given sequential compression devices, early ambulation, and TED for DVT prophylaxis.  She benefited maximally from the hospital stay and there were no complications.    Recent vital signs:  Filed Vitals:   09/21/14 1214  BP: 103/63  Pulse: 52  Temp: 98.3 F (36.8 C)  Resp: 18    Recent  laboratory studies:  Lab Results  Component Value Date   HGB 12.4 09/21/2014   HGB 14.3 09/13/2014   HGB 10.0* 11/24/2013   Lab Results  Component Value Date   WBC 8.7 09/21/2014   PLT 242 09/21/2014   Lab Results  Component Value Date   INR 1.20 09/21/2014   Lab Results  Component Value Date   NA 145 09/13/2014   K 3.5 09/13/2014   CL 105 09/13/2014   CO2 29 09/13/2014   BUN 6 09/13/2014   CREATININE 0.74 09/13/2014   GLUCOSE 94 09/13/2014    Discharge Medications:     Medication List    STOP taking these medications        traMADol 50 MG tablet  Commonly known as:  ULTRAM      TAKE these medications        levothyroxine 175 MCG tablet  Commonly known as:  SYNTHROID, LEVOTHROID  Take 175 mcg by mouth daily.     methocarbamol 500 MG tablet  Commonly known as:  ROBAXIN  Take 1 tablet (500 mg total) by mouth 3 (three) times daily as needed for muscle spasms.     ondansetron 4 MG tablet  Commonly known as:  ZOFRAN  Take 1 tablet (4 mg total) by mouth every 8 (eight) hours as needed for  nausea or vomiting.     oxyCODONE-acetaminophen 10-325 MG per tablet  Commonly known as:  PERCOCET  Take 1 tablet by mouth every 4 (four) hours as needed for pain.     warfarin 5 MG tablet  Commonly known as:  COUMADIN  Take 5 mg by mouth daily.        Diagnostic Studies: Dg Cervical Spine 2 Or 3 Views  09/20/2014   CLINICAL DATA:  Status post C5-6 disc replacement  EXAM: CERVICAL SPINE - 2-3 VIEW  COMPARISON:  Intraoperative cervical spine radiographs from earlier today.  FINDINGS: The cervical spine is visualized on into the level of the C5-6 disc on the lateral view. There is straightening of the cervical spine. There is a tiny amount of soft tissue gas in the prevertebral soft tissues at the level of C5, within expected postoperative limits. The visualized cervical vertebral body heights are preserved. Inferior C5 endplate and superior C6 endplate prostheses appear well  positioned. The visualized remaining cervical disc heights are preserved. No spondylolisthesis is seen in the visualized cervical spine.  IMPRESSION: Satisfactory immediate postoperative appearance status post C5-6 disc replacement. Straightening of the cervical spine is likely positional and/or due to muscle spasm.   Electronically Signed   By: Ilona Sorrel M.D.   On: 09/20/2014 14:55   Dg Cervical Spine 2-3 Views  09/20/2014   CLINICAL DATA:  Intraoperative fluoroscopy for total disc replacement at C5-6.  EXAM: DG C-ARM 61-120 MIN; CERVICAL SPINE - 2-3 VIEW  COMPARISON:  None.  FINDINGS: Three spot intraoperative fluoroscopic radiographs of the cervical spine were provided and are nondiagnostic. Tube courses into the trachea. Intervertebral disc prosthesis appears well positioned at C5-6. Fluoroscopy time 55 seconds.  IMPRESSION: Intraoperative fluoroscopy for C5-6 disc replacement.   Electronically Signed   By: Ilona Sorrel M.D.   On: 09/20/2014 10:16   Dg C-arm 61-120 Min  09/20/2014   CLINICAL DATA:  Intraoperative fluoroscopy for total disc replacement at C5-6.  EXAM: DG C-ARM 61-120 MIN; CERVICAL SPINE - 2-3 VIEW  COMPARISON:  None.  FINDINGS: Three spot intraoperative fluoroscopic radiographs of the cervical spine were provided and are nondiagnostic. Tube courses into the trachea. Intervertebral disc prosthesis appears well positioned at C5-6. Fluoroscopy time 55 seconds.  IMPRESSION: Intraoperative fluoroscopy for C5-6 disc replacement.   Electronically Signed   By: Ilona Sorrel M.D.   On: 09/20/2014 10:16    Disposition: 01-Home or Self Care        Follow-up Information    Follow up with Dahlia Bailiff, MD. Schedule an appointment as soon as possible for a visit in 2 weeks.   Specialty:  Orthopedic Surgery   Why:  If symptoms worsen, For suture removal, For wound re-check   Contact information:   [address] [place] [address] [phone]         Signed: Valinda Hoar 10/08/2014, 1:07 PM

## 2014-11-07 ENCOUNTER — Ambulatory Visit
Admission: RE | Admit: 2014-11-07 | Discharge: 2014-11-07 | Disposition: A | Discharge status: Home / Self Care | Payer: Managed Care, Other (non HMO) | Source: Ambulatory Visit | Attending: Internal Medicine | Admitting: Internal Medicine

## 2014-11-07 DIAGNOSIS — Z1231 Encounter for screening mammogram for malignant neoplasm of breast: Secondary | ICD-10-CM

## 2016-02-13 ENCOUNTER — Encounter (HOSPITAL_COMMUNITY): Payer: Self-pay | Admitting: Family Medicine

## 2016-02-13 ENCOUNTER — Ambulatory Visit (HOSPITAL_COMMUNITY)
Admission: EM | Admit: 2016-02-13 | Discharge: 2016-02-13 | Disposition: A | Discharge status: Home / Self Care | Payer: Managed Care, Other (non HMO) | Attending: Family Medicine | Admitting: Family Medicine

## 2016-02-13 DIAGNOSIS — R6889 Other general symptoms and signs: Secondary | ICD-10-CM

## 2016-02-13 MED ORDER — OSELTAMIVIR PHOSPHATE 75 MG PO CAPS: 75.0000 mg | ORAL_CAPSULE | Freq: Two times a day (BID) | ORAL | 0 refills | Status: DC

## 2016-02-13 NOTE — ED Triage Notes (Signed)
Pt is reporting tightness in her chest, head congestion, sore throat, ear pressure and body aches that started this morning.

## 2016-02-13 NOTE — Discharge Instructions (Signed)
Your symptoms are most consistent with influenza. If we start the medicine tonight, you will have your best chance of getting better quickly.

## 2016-02-13 NOTE — ED Provider Notes (Signed)
[facility]    CSN: [id number] Arrival date & time: 02/13/16  1727     History   Chief Complaint Chief Complaint  Patient presents with  . URI    HPI Laura Molina is a 56 y.o. female.   This is a 56 year old woman who presents to the [facility] urgent care center for evaluation of flulike symptoms. She became sick this morning and dramatically worsened as the day progressed with muscle aches, runny nose, head fullness, and chest tightness. She took Naprosyn this morning as she usually does in his combination form with omeprazole.  She's had no cough      Past Medical History:  Diagnosis Date  . Clotting disorder ([place])    RH negative 8  . Deep vein thrombosis (DVT) ([facility])    several over the years  . Depression    not taking medicine   . Dizziness   . Family history of anesthesia complication    family hx nausea and vomiting  . Fibroid   . GERD (gastroesophageal reflux disease)   . Headache   . Hypothyroidism   . Numbness in both hands    tendonitis  . PONV (postoperative nausea and vomiting)    difficulty waking up   . Thyroid disease     Patient Active Problem List   Diagnosis Date Noted  . Neck pain 09/20/2014  . Hypernatremia   . Hypokalemia 11/22/2013  . History of DVT (deep vein thrombosis) 11/22/2013  . Hypothyroidism 11/22/2013  . Normocytic normochromic anemia 11/22/2013  . Abdominal discomfort   . Hx of laparoscopic adjustable gastric banding 04/09/2011    Past Surgical History:  Procedure Laterality Date  . ABDOMINAL HYSTERECTOMY    . CERVICAL DISC ARTHROPLASTY N/A 09/20/2014   Procedure: TOTAL DISC REPLACEMENT C5-6;  Surgeon: Melina Schools, MD;  Location: [facility];  Service: Orthopedics;  Laterality: N/A;  . CESAREAN SECTION     x 3  . CHOLECYSTECTOMY    . COLONOSCOPY WITH PROPOFOL N/A 01/03/2013   Procedure: COLONOSCOPY WITH PROPOFOL;  Surgeon: Garlan Fair, MD;  Location: [facility];  Service:  Endoscopy;  Laterality: N/A;  . LAPAROSCOPIC GASTRIC BANDING  04/06/06  . OOPHORECTOMY    . TENDON RELEASE     bilateral  elbow  . TONSILLECTOMY  as child  . ulnar nerve release Right jan 2014  . VENA CAVA FILTER PLACEMENT     left  groin    OB History    No data available       Home Medications    Prior to Admission medications   Medication Sig Start Date End Date Taking? Authorizing Provider  levothyroxine (SYNTHROID, LEVOTHROID) 175 MCG tablet Take 175 mcg by mouth daily.   Yes Historical Provider, MD  Naproxen-Esomeprazole 500-20 MG TBEC Take 1 tablet by mouth 2 (two) times daily.   Yes Historical Provider, MD  warfarin (COUMADIN) 5 MG tablet Take 5 mg by mouth daily.   Yes Historical Provider, MD  oseltamivir (TAMIFLU) 75 MG capsule Take 1 capsule (75 mg total) by mouth every 12 (twelve) hours. 02/13/16   Robyn Haber, MD    Family History Family History  Problem Relation Age of Onset  . Diabetes Mellitus II Mother   . Diabetes Mellitus II Father     Social History Social History  Substance Use Topics  . Smoking status: Former Smoker    Packs/day: 1.00    Years: 4.00    Types: Cigarettes  Quit date: 01/19/1993  . Smokeless tobacco: Never Used  . Alcohol use No     Allergies   Aspirin; Latex; and Motrin [ibuprofen]   Review of Systems Review of Systems  Constitutional: Positive for fatigue.  HENT: Positive for congestion.   Respiratory: Positive for chest tightness.   Cardiovascular: Negative.   Gastrointestinal: Negative.   Genitourinary: Negative.   Musculoskeletal: Positive for myalgias.  Neurological: Positive for headaches.     Physical Exam Triage Vital Signs ED Triage Vitals  Enc Vitals Group     BP      Pulse      Resp      Temp      Temp src      SpO2      Weight      Height      Head Circumference      Peak Flow      Pain Score      Pain Loc      Pain Edu?      Excl. in [place]?    No data found.   Updated Vital Signs BP  144/88 (BP Location: Right Arm)   Pulse (!) 59   Temp 98.4 F (36.9 C) (Oral)   SpO2 98%   Visual Acuity Right Eye Distance:   Left Eye Distance:   Bilateral Distance:    Right Eye Near:   Left Eye Near:    Bilateral Near:     Physical Exam  Constitutional: She is oriented to person, place, and time. She appears well-developed and well-nourished.  HENT:  Head: Normocephalic.  Right Ear: External ear normal.  Left Ear: External ear normal.  Mouth/Throat: Oropharynx is clear and moist.  Eyes: Conjunctivae and EOM are normal. Pupils are equal, round, and reactive to light.  Neck: Normal range of motion. Neck supple.  Cardiovascular: Normal rate, regular rhythm and normal heart sounds.   Pulmonary/Chest: Effort normal and breath sounds normal.  Musculoskeletal: Normal range of motion.  Neurological: She is alert and oriented to person, place, and time.  Skin: Skin is warm and dry.  Nursing note and vitals reviewed.    UC Treatments / Results  Labs (all labs ordered are listed, but only abnormal results are displayed) Labs Reviewed - No data to display  EKG  EKG Interpretation None       Radiology No results found.  Procedures Procedures (including critical care time)  Medications Ordered in UC Medications - No data to display   Initial Impression / Assessment and Plan / UC Course  I have reviewed the triage vital signs and the nursing notes.  Pertinent labs & imaging results that were available during my care of the patient were reviewed by me and considered in my medical decision making (see chart for details).     Final Clinical Impressions(s) / UC Diagnoses   Final diagnoses:  Flu-like symptoms    New Prescriptions New Prescriptions   OSELTAMIVIR (TAMIFLU) 75 MG CAPSULE    Take 1 capsule (75 mg total) by mouth every 12 (twelve) hours.     Robyn Haber, MD 02/13/16 [phone]

## 2016-07-27 ENCOUNTER — Emergency Department (HOSPITAL_COMMUNITY): Payer: Managed Care, Other (non HMO)

## 2016-07-27 ENCOUNTER — Observation Stay (HOSPITAL_COMMUNITY)
Admission: EM | Admit: 2016-07-27 | Discharge: 2016-07-29 | Disposition: A | Discharge status: Home / Self Care | Payer: Managed Care, Other (non HMO) | Attending: Internal Medicine | Admitting: Internal Medicine

## 2016-07-27 ENCOUNTER — Encounter (HOSPITAL_COMMUNITY): Payer: Self-pay | Admitting: Emergency Medicine

## 2016-07-27 DIAGNOSIS — E876 Hypokalemia: Secondary | ICD-10-CM | POA: Diagnosis not present

## 2016-07-27 DIAGNOSIS — R072 Precordial pain: Principal | ICD-10-CM

## 2016-07-27 DIAGNOSIS — Z91040 Latex allergy status: Secondary | ICD-10-CM | POA: Insufficient documentation

## 2016-07-27 DIAGNOSIS — Z9049 Acquired absence of other specified parts of digestive tract: Secondary | ICD-10-CM | POA: Insufficient documentation

## 2016-07-27 DIAGNOSIS — Z87891 Personal history of nicotine dependence: Secondary | ICD-10-CM | POA: Diagnosis not present

## 2016-07-27 DIAGNOSIS — Z86718 Personal history of other venous thrombosis and embolism: Secondary | ICD-10-CM

## 2016-07-27 DIAGNOSIS — E039 Hypothyroidism, unspecified: Secondary | ICD-10-CM | POA: Diagnosis not present

## 2016-07-27 DIAGNOSIS — R079 Chest pain, unspecified: Secondary | ICD-10-CM

## 2016-07-27 LAB — BASIC METABOLIC PANEL
ANION GAP: 9 (ref 5–15)
BUN: 8 mg/dL (ref 6–20)
CALCIUM: 9.5 mg/dL (ref 8.9–10.3)
CO2: 24 mmol/L (ref 22–32)
Chloride: 104 mmol/L (ref 101–111)
Creatinine, Ser: 0.78 mg/dL (ref 0.44–1.00)
Glucose, Bld: 104 mg/dL — ABNORMAL HIGH (ref 65–99)
POTASSIUM: 2.7 mmol/L — AB (ref 3.5–5.1)
SODIUM: 137 mmol/L (ref 135–145)

## 2016-07-27 LAB — CBC
HEMATOCRIT: 41.1 % (ref 36.0–46.0)
HEMOGLOBIN: 13.3 g/dL (ref 12.0–15.0)
MCH: 26.4 pg (ref 26.0–34.0)
MCHC: 32.4 g/dL (ref 30.0–36.0)
MCV: 81.5 fL (ref 78.0–100.0)
Platelets: 235 10*3/uL (ref 150–400)
RBC: 5.04 MIL/uL (ref 3.87–5.11)
RDW: 14.4 % (ref 11.5–15.5)
WBC: 7.1 10*3/uL (ref 4.0–10.5)

## 2016-07-27 LAB — PROTIME-INR
INR: 1.89
PROTHROMBIN TIME: 22 s — AB (ref 11.4–15.2)

## 2016-07-27 LAB — I-STAT TROPONIN, ED: TROPONIN I, POC: 0 ng/mL (ref 0.00–0.08)

## 2016-07-27 LAB — D-DIMER, QUANTITATIVE (NOT AT ARMC)

## 2016-07-27 LAB — BRAIN NATRIURETIC PEPTIDE: B NATRIURETIC PEPTIDE 5: 9.1 pg/mL (ref 0.0–100.0)

## 2016-07-27 LAB — TROPONIN I

## 2016-07-27 MED ORDER — PANTOPRAZOLE SODIUM 40 MG PO TBEC
40.0000 mg | DELAYED_RELEASE_TABLET | Freq: Every day | ORAL | Status: DC
  Administered 2016-07-28 – 2016-07-29 (×2): 40 mg via ORAL
  Filled 2016-07-27 (×2): qty 1

## 2016-07-27 MED ORDER — POTASSIUM CHLORIDE IN NACL 20-0.9 MEQ/L-% IV SOLN: Freq: Once | INTRAVENOUS | Status: DC

## 2016-07-27 MED ORDER — ENOXAPARIN SODIUM 40 MG/0.4ML ~~LOC~~ SOLN: 40.0000 mg | SUBCUTANEOUS | Status: DC

## 2016-07-27 MED ORDER — POTASSIUM CHLORIDE 10 MEQ/100ML IV SOLN
10.0000 meq | INTRAVENOUS | Status: AC
  Administered 2016-07-27 (×2): 10 meq via INTRAVENOUS
  Filled 2016-07-27 (×2): qty 100

## 2016-07-27 MED ORDER — POTASSIUM CHLORIDE CRYS ER 20 MEQ PO TBCR
40.0000 meq | EXTENDED_RELEASE_TABLET | Freq: Once | ORAL | Status: AC
  Administered 2016-07-27: 40 meq via ORAL
  Filled 2016-07-27: qty 2

## 2016-07-27 MED ORDER — SODIUM CHLORIDE 0.9% FLUSH
3.0000 mL | Freq: Two times a day (BID) | INTRAVENOUS | Status: DC
  Administered 2016-07-28 (×2): 3 mL via INTRAVENOUS

## 2016-07-27 MED ORDER — ACETAMINOPHEN 650 MG RE SUPP: 650.0000 mg | Freq: Four times a day (QID) | RECTAL | Status: DC | PRN

## 2016-07-27 MED ORDER — LEVOTHYROXINE SODIUM 75 MCG PO TABS
175.0000 ug | ORAL_TABLET | Freq: Every day | ORAL | Status: DC
  Administered 2016-07-28 – 2016-07-29 (×2): 175 ug via ORAL
  Filled 2016-07-27 (×2): qty 1

## 2016-07-27 MED ORDER — ACETAMINOPHEN 325 MG PO TABS: 650.0000 mg | ORAL_TABLET | Freq: Four times a day (QID) | ORAL | Status: DC | PRN

## 2016-07-27 NOTE — Progress Notes (Signed)
   07/27/16 2231  Vitals  Temp 97.8 F (36.6 C)  Temp Source Oral  BP (!) 143/92  BP Location Right Arm  BP Method Automatic  Patient Position (if appropriate) Sitting  Pulse Rate (!) 58  Pulse Rate Source Monitor  Resp 18  Oxygen Therapy  SpO2 98 %  O2 Device Room Air  Admitted pt to rm 3W01 from ED, pt alert and oriented, denied pain at this time, oriented to room, call bell placed within reach, placed on cardiac montior, CCMD made aware.

## 2016-07-27 NOTE — ED Triage Notes (Signed)
Cp x a couple of weeks that comes and goes and she has  Sob that started today , denies n/v  Has had radiation to left arm

## 2016-07-27 NOTE — H&P (Signed)
[facility] H&P   Patient Demographics:    Laura Molina, is a 56 y.o. female  MRN: [id number]   DOB - 1960-09-13  Admit Date - 07/27/2016  Outpatient Primary MD for the patient is Seward Carol, MD  Referring MD/NP/PA:  Hassel Neth  Outpatient Specialists: Clearnce Hasten  Patient coming from: home  Chief Complaint  Patient presents with  . Chest Pain      HPI:    Laura Molina  is a 56 y.o. female, w hx of DVT x4, IVC Filter apparently c/o chest pain, left sided "cramping" radiation to the left arm.  Intermittent for a couple of weeks. Slight dyspnea.  Denies fever, chills, cough, palp, n/v, diarrhea, brbpr.  Nothing appears to make the pain better or worse.  Pt presented to increase dyspnea this evening .   In ED,  CXR negative ,  EKG nsr at 88, nl axis, nl int, no st-t changes c/w ischemia.  Trop I negative  D dimer negative,  INR 1.89 , K=2.7,  Pt will be admitted for w/up of chest pain and hypokalemia.     Review of systems:    In addition to the HPI above, No Fever-chills, No Headache, No changes with Vision or hearing, No problems swallowing food or Liquids, No Cough or Shortness of Breath, No Abdominal pain, No Nausea or Vommitting, Bowel movements are regular, No Blood in stool or Urine, No dysuria, No new skin rashes or bruises, No new joints pains-aches,  No new weakness, tingling, numbness in any extremity, No recent weight gain or loss, No polyuria, polydypsia or polyphagia, No significant Mental Stressors.  A full 10 point Review of Systems was done, except as stated above, all other Review of Systems were negative.   With Past History of the following :    Past Medical History:  Diagnosis Date  . Clotting disorder ([place])    RH negative 8  . Deep vein thrombosis (DVT) ([facility])    several over the years  . Depression    not taking medicine   . Dizziness   .  Family history of anesthesia complication    family hx nausea and vomiting  . Fibroid   . GERD (gastroesophageal reflux disease)   . Headache   . Hypothyroidism   . Numbness in both hands    tendonitis  . PONV (postoperative nausea and vomiting)    difficulty waking up   . Thyroid disease       Past Surgical History:  Procedure Laterality Date  . ABDOMINAL HYSTERECTOMY    . CERVICAL DISC ARTHROPLASTY N/A 09/20/2014   Procedure: TOTAL DISC REPLACEMENT C5-6;  Surgeon: Melina Schools, MD;  Location: [facility];  Service: Orthopedics;  Laterality: N/A;  . CESAREAN SECTION     x 3  . CHOLECYSTECTOMY    . COLONOSCOPY WITH PROPOFOL N/A 01/03/2013   Procedure: COLONOSCOPY WITH PROPOFOL;  Surgeon: Garlan Fair, MD;  Location: [facility];  Service: Endoscopy;  Laterality: N/A;  . LAPAROSCOPIC GASTRIC BANDING  04/06/06  . OOPHORECTOMY    . TENDON RELEASE     bilateral  elbow  . TONSILLECTOMY  as child  . ulnar nerve release Right jan 2014  . VENA CAVA FILTER PLACEMENT     left  groin      Social History:     Social History  Substance Use Topics  . Smoking status: Former Smoker    Packs/day: 1.00    Years: 4.00    Types: Cigarettes    Quit date: 01/19/1993  . Smokeless tobacco: Never Used  . Alcohol use No     Lives - at home w husband  Mobility -  Walks by self    Family History :     Family History  Problem Relation Age of Onset  . Diabetes Mellitus II Mother   . CAD Mother   . Stroke Mother   . Cirrhosis Mother        Hepatitis C  . Diabetes Mellitus II Father   . CAD Father       Home Medications:   Prior to Admission medications   Medication Sig Start Date End Date Taking? Authorizing Provider  levothyroxine (SYNTHROID, LEVOTHROID) 175 MCG tablet Take 175 mcg by mouth daily.   Yes [provider]  warfarin (COUMADIN) 5 MG tablet Take 5 mg by mouth daily.   Yes [provider]  oseltamivir (TAMIFLU) 75 MG capsule Take 1 capsule (75 mg  total) by mouth every 12 (twelve) hours. Patient not taking: Reported on 07/27/2016 02/13/16   Robyn Haber, MD     Allergies:     Allergies  Allergen Reactions  . Aspirin Other (See Comments)    DRUG INTERACTION: Patient on Coumadin  . Latex Rash  . Motrin [Ibuprofen] Rash     Physical Exam:   Vitals  Blood pressure 120/76, pulse 61, temperature 98.3 F (36.8 C), temperature source Oral, resp. rate 18, SpO2 99 %.   1. General  lying in bed in NAD,    2. Normal affect and insight, Not Suicidal or Homicidal, Awake Alert, Oriented X 3.  3. No F.N deficits, ALL C.Nerves Intact, Strength 5/5 all 4 extremities, Sensation intact all 4 extremities, Plantars down going.  4. Ears and Eyes appear Normal, Conjunctivae clear, PERRLA. Moist Oral Mucosa.  5. Supple Neck, No JVD, No cervical lymphadenopathy appriciated, No Carotid Bruits.  6. Symmetrical Chest wall movement, Good air movement bilaterally, CTAB.  7. RRR, No Gallops, Rubs or Murmurs, No Parasternal Heave.  8. Positive Bowel Sounds, Abdomen Soft, No tenderness, No organomegaly appriciated,No rebound -guarding or rigidity.  9.  No Cyanosis, Normal Skin Turgor, No Skin Rash or Bruise.  10. Good muscle tone,  joints appear normal , no effusions, Normal ROM.  11. No Palpable Lymph Nodes in Neck or Axillae     Data Review:    CBC  Recent Labs Lab 07/27/16 1705  WBC 7.1  HGB 13.3  HCT 41.1  PLT 235  MCV 81.5  MCH 26.4  MCHC 32.4  RDW 14.4   ------------------------------------------------------------------------------------------------------------------  Chemistries   Recent Labs Lab 07/27/16 1705  NA 137  K 2.7*  CL 104  CO2 24  GLUCOSE 104*  BUN 8  CREATININE 0.78  CALCIUM 9.5   ------------------------------------------------------------------------------------------------------------------ CrCl cannot be calculated (Unknown ideal  weight.). ------------------------------------------------------------------------------------------------------------------ No results for input(s): TSH, T4TOTAL, T3FREE, THYROIDAB in  the last 72 hours.  Invalid input(s): FREET3  Coagulation profile  Recent Labs Lab 07/27/16 1705  INR 1.89   -------------------------------------------------------------------------------------------------------------------  Recent Labs  07/27/16 1705  DDIMER <0.27   -------------------------------------------------------------------------------------------------------------------  Cardiac Enzymes No results for input(s): CKMB, TROPONINI, MYOGLOBIN in the last 168 hours.  Invalid input(s): CK ------------------------------------------------------------------------------------------------------------------    Component Value Date/Time   BNP 9.1 07/27/2016 2003     ---------------------------------------------------------------------------------------------------------------  Urinalysis    Component Value Date/Time   COLORURINE YELLOW 11/22/2013 2014   APPEARANCEUR CLEAR 11/22/2013 2014   LABSPEC 1.004 (L) 11/22/2013 2014   PHURINE 7.0 11/22/2013 2014   GLUCOSEU NEGATIVE 11/22/2013 2014   HGBUR NEGATIVE 11/22/2013 2014   [place] NEGATIVE 11/22/2013 2014   [place] NEGATIVE 11/22/2013 2014   PROTEINUR NEGATIVE 11/22/2013 2014   UROBILINOGEN 0.2 11/22/2013 2014   NITRITE NEGATIVE 11/22/2013 2014   LEUKOCYTESUR SMALL (A) 11/22/2013 2014    ----------------------------------------------------------------------------------------------------------------   Imaging Results:    Dg Chest 2 View  Result Date: 07/27/2016 CLINICAL DATA:  Chest pain for 2 weeks EXAM: CHEST  2 VIEW COMPARISON:  November 22, 2013, Jun 11, 2010 FINDINGS: The heart size is normal. Asymmetric prominence of right hilum is stable compared to prior exam of 2012. There is no focal infiltrate, pulmonary edema, or  pleural effusion. The visualized skeletal structures are unremarkable. IMPRESSION: No active cardiopulmonary disease. Electronically Signed   By: Abelardo Diesel M.D.   On: 07/27/2016 18:17       Assessment & Plan:    Active Problems:   Hypokalemia   History of DVT (deep vein thrombosis)   Hypothyroidism   Chest pain    Chest pain Tele Trop I q6hx 3 Check cardiac echo NPO after [facility] consult, emailed.    Dyspnea?Pox wnl  Cardiac echo r/o pulmonary hypertension Obtain  PFT as outpatient   Hypokalemia Replete,  Check cmp in am  DVT Prophylaxis Lovenox- SCDs   AM Labs Ordered, also please review Full Orders  Family Communication: Admission, patients condition and plan of care including tests being ordered have been discussed with the patient  who indicate understanding and agree with the plan and Code Status.  Code Status  FULL CODE  Likely DC to home  Condition GUARDED    Consults called: cardiology emailed  Admission status: observation  Time spent in minutes : 45 minutes   Jani Gravel M.D on 07/27/2016 at 10:18 PM  Between 7am to 7pm - Pager - [phone]. After 7pm go to [email or web address] - password [facility]  [facility] - Office  [phone]

## 2016-07-27 NOTE — ED Provider Notes (Signed)
Emergency Department Provider Note   I have reviewed the triage vital signs and the nursing notes.   HISTORY  Chief Complaint Chest Pain   HPI Laura Molina is a 56 y.o. female with PMH of DVT on Coumadin, HTN, HLD, and pre-DM with CAD family history (mother with MI at age 6) presents to the emergency department for evaluation of intermittent left chest pain/pressure that radiates to the neck and left arm. Patient has had symptoms intermittently over the last 2 weeks but seems to be worsening significantly over the past 24 hours. No pain currently. She denies any associated diaphoresis, palpitations, or near syncope. She is not a smoker. No prior chest pain similar to this. She recalls a very distant stress test but no recent cardiovascular evaluation. No modifying factors. No pleuritic pain.    Past Medical History:  Diagnosis Date  . Clotting disorder ([place])    RH negative 8  . Deep vein thrombosis (DVT) ([facility])    several over the years  . Depression    not taking medicine   . Dizziness   . Family history of anesthesia complication    family hx nausea and vomiting  . Fibroid   . GERD (gastroesophageal reflux disease)   . Headache   . Hypothyroidism   . Numbness in both hands    tendonitis  . PONV (postoperative nausea and vomiting)    difficulty waking up   . Thyroid disease     Patient Active Problem List   Diagnosis Date Noted  . Chest pain 07/27/2016  . Neck pain 09/20/2014  . Hypernatremia   . Hypokalemia 11/22/2013  . History of DVT (deep vein thrombosis) 11/22/2013  . Hypothyroidism 11/22/2013  . Normocytic normochromic anemia 11/22/2013  . Abdominal discomfort   . Hx of laparoscopic adjustable gastric banding 04/09/2011    Past Surgical History:  Procedure Laterality Date  . ABDOMINAL HYSTERECTOMY    . CERVICAL DISC ARTHROPLASTY N/A 09/20/2014   Procedure: TOTAL DISC REPLACEMENT C5-6;  Surgeon: Melina Schools, MD;  Location: [facility];  Service:  Orthopedics;  Laterality: N/A;  . CESAREAN SECTION     x 3  . CHOLECYSTECTOMY    . COLONOSCOPY WITH PROPOFOL N/A 01/03/2013   Procedure: COLONOSCOPY WITH PROPOFOL;  Surgeon: Garlan Fair, MD;  Location: [facility];  Service: Endoscopy;  Laterality: N/A;  . LAPAROSCOPIC GASTRIC BANDING  04/06/06  . OOPHORECTOMY    . TENDON RELEASE     bilateral  elbow  . TONSILLECTOMY  as child  . ulnar nerve release Right jan 2014  . VENA CAVA FILTER PLACEMENT     left  groin      Allergies Aspirin; Latex; and Motrin [ibuprofen]  Family History  Problem Relation Age of Onset  . Diabetes Mellitus II Mother   . CAD Mother   . Stroke Mother   . Cirrhosis Mother        Hepatitis C  . Diabetes Mellitus II Father   . CAD Father     Social History Social History  Substance Use Topics  . Smoking status: Former Smoker    Packs/day: 1.00    Years: 4.00    Types: Cigarettes    Quit date: 01/19/1993  . Smokeless tobacco: Never Used  . Alcohol use No    Review of Systems  Constitutional: No fever/chills Eyes: No visual changes. ENT: No sore throat. Cardiovascular: Positive chest pain. Respiratory: Denies shortness of breath. Gastrointestinal: No abdominal pain.  No nausea, no vomiting.  No diarrhea.  No constipation. Genitourinary: Negative for dysuria. Musculoskeletal: Negative for back pain. Skin: Negative for rash. Neurological: Negative for headaches, focal weakness or numbness.  10-point ROS otherwise negative.  ____________________________________________   PHYSICAL EXAM:  VITAL SIGNS: ED Triage Vitals [07/27/16 1658]  Enc Vitals Group     BP 121/84     Pulse Rate 90     Resp 15     Temp 98.3 F (36.8 C)     Temp Source Oral     SpO2 99 %   Constitutional: Alert and oriented. Well appearing and in no acute distress. Eyes: Conjunctivae are normal.  Head: Atraumatic. Nose: No congestion/rhinnorhea. Mouth/Throat: Mucous membranes are moist.   Neck: No  stridor. Cardiovascular: Normal rate, regular rhythm. Good peripheral circulation. Grossly normal heart sounds.   Respiratory: Normal respiratory effort.  No retractions. Lungs CTAB. Gastrointestinal: Soft and nontender. No distention.  Musculoskeletal: No lower extremity tenderness nor edema. No gross deformities of extremities. Neurologic:  Normal speech and language. No gross focal neurologic deficits are appreciated.  Skin:  Skin is warm, dry and intact. No rash noted.  ____________________________________________   LABS (all labs ordered are listed, but only abnormal results are displayed)  Labs Reviewed  BASIC METABOLIC PANEL - Abnormal; Notable for the following:       Result Value   Potassium 2.7 (*)    Glucose, Bld 104 (*)    All other components within normal limits  PROTIME-INR - Abnormal; Notable for the following:    Prothrombin Time 22.0 (*)    All other components within normal limits  CBC  D-DIMER, QUANTITATIVE (NOT AT [facility])  BRAIN NATRIURETIC PEPTIDE  HIV ANTIBODY (ROUTINE TESTING)  COMPREHENSIVE METABOLIC PANEL  CBC  HEMOGLOBIN A1C  TROPONIN I  TROPONIN I  TROPONIN I  MAGNESIUM  LIPID PANEL  I-STAT TROPOININ, ED  I-STAT TROPOININ, ED   ____________________________________________  EKG   EKG Interpretation  Date/Time:  Monday July 27 2016 16:53:07 EDT Ventricular Rate:  88 PR Interval:  134 QRS Duration: 86 QT Interval:  376 QTC Calculation: 454 R Axis:   83 Text Interpretation:  Normal sinus rhythm Nonspecific ST abnormality Abnormal ECG No STEMI.  Confirmed by Nanda Quinton [phone]) on 07/27/2016 6:32:38 PM       ____________________________________________  RADIOLOGY  Dg Chest 2 View  Result Date: 07/27/2016 CLINICAL DATA:  Chest pain for 2 weeks EXAM: CHEST  2 VIEW COMPARISON:  November 22, 2013, Jun 11, 2010 FINDINGS: The heart size is normal. Asymmetric prominence of right hilum is stable compared to prior exam of 2012. There is no focal  infiltrate, pulmonary edema, or pleural effusion. The visualized skeletal structures are unremarkable. IMPRESSION: No active cardiopulmonary disease. Electronically Signed   By: Abelardo Diesel M.D.   On: 07/27/2016 18:17    ____________________________________________   PROCEDURES  Procedure(s) performed:   Procedures  None ____________________________________________   INITIAL IMPRESSION / ASSESSMENT AND PLAN / ED COURSE  Pertinent labs & imaging results that were available during my care of the patient were reviewed by me and considered in my medical decision making (see chart for details).  Patient presents to the emergency department for evaluation of chest pain intermittently over the past 2 weeks. She has an unremarkable EKG. I-STAT troponin ordered from triage but results not available to me. Plan to repeat this lab. No active chest pain. Patient does have hypokalemia to 2.7. She has a remote history of this and was briefly on oral supplementation but  stopped approximately one year prior. Patient's heart score is 4. Anticipate observation admission for chest pain evaluation.  Discussed patient's case with Hospitalist, Dr. Maudie Mercury. Patient and family (if present) updated with plan. Care transferred to Hospitalist service.  I reviewed all nursing notes, vitals, pertinent old records, EKGs, labs, imaging (as available).  ____________________________________________  FINAL CLINICAL IMPRESSION(S) / ED DIAGNOSES  Final diagnoses:  Precordial chest pain     MEDICATIONS GIVEN DURING THIS VISIT:  Medications  levothyroxine (SYNTHROID, LEVOTHROID) tablet 175 mcg (not administered)  enoxaparin (LOVENOX) injection 40 mg (not administered)  sodium chloride flush (NS) 0.9 % injection 3 mL (3 mLs Intravenous Not Given 07/27/16 2305)  acetaminophen (TYLENOL) tablet 650 mg (not administered)    Or  acetaminophen (TYLENOL) suppository 650 mg (not administered)  pantoprazole (PROTONIX) EC  tablet 40 mg (not administered)  potassium chloride SA (K-DUR,KLOR-CON) CR tablet 40 mEq (40 mEq Oral Given 07/27/16 1957)  potassium chloride 10 mEq in 100 mL IVPB (0 mEq Intravenous Stopped 07/27/16 2305)     NEW OUTPATIENT MEDICATIONS STARTED DURING THIS VISIT:  None   Note:  This document was prepared using Dragon voice recognition software and may include unintentional dictation errors.  Nanda Quinton, MD Emergency Medicine  Schyler Butikofer, [facility], MD 07/27/16 2329

## 2016-07-28 ENCOUNTER — Encounter (HOSPITAL_COMMUNITY): Payer: Self-pay | Admitting: Radiology

## 2016-07-28 ENCOUNTER — Observation Stay (HOSPITAL_COMMUNITY): Payer: Managed Care, Other (non HMO)

## 2016-07-28 DIAGNOSIS — E876 Hypokalemia: Secondary | ICD-10-CM | POA: Diagnosis not present

## 2016-07-28 DIAGNOSIS — K219 Gastro-esophageal reflux disease without esophagitis: Secondary | ICD-10-CM | POA: Diagnosis not present

## 2016-07-28 DIAGNOSIS — E039 Hypothyroidism, unspecified: Secondary | ICD-10-CM | POA: Diagnosis not present

## 2016-07-28 DIAGNOSIS — Z86718 Personal history of other venous thrombosis and embolism: Secondary | ICD-10-CM

## 2016-07-28 DIAGNOSIS — R079 Chest pain, unspecified: Secondary | ICD-10-CM | POA: Diagnosis not present

## 2016-07-28 DIAGNOSIS — Z9884 Bariatric surgery status: Secondary | ICD-10-CM | POA: Diagnosis not present

## 2016-07-28 LAB — COMPREHENSIVE METABOLIC PANEL
ALBUMIN: 3.5 g/dL (ref 3.5–5.0)
ALT: 13 U/L — AB (ref 14–54)
AST: 19 U/L (ref 15–41)
Alkaline Phosphatase: 53 U/L (ref 38–126)
Anion gap: 7 (ref 5–15)
BUN: 6 mg/dL (ref 6–20)
CO2: 28 mmol/L (ref 22–32)
CREATININE: 0.85 mg/dL (ref 0.44–1.00)
Calcium: 9 mg/dL (ref 8.9–10.3)
Chloride: 106 mmol/L (ref 101–111)
GFR calc Af Amer: >60 mL/min (ref >60)
Glucose, Bld: 92 mg/dL (ref 65–99)
POTASSIUM: 3.3 mmol/L — AB (ref 3.5–5.1)
Sodium: 141 mmol/L (ref 135–145)
Total Bilirubin: 0.5 mg/dL (ref 0.3–1.2)
Total Protein: 6.3 g/dL — ABNORMAL LOW (ref 6.5–8.1)

## 2016-07-28 LAB — CBC
HEMATOCRIT: 37.8 % (ref 36.0–46.0)
Hemoglobin: 12.1 g/dL (ref 12.0–15.0)
MCH: 26.4 pg (ref 26.0–34.0)
MCHC: 32 g/dL (ref 30.0–36.0)
MCV: 82.5 fL (ref 78.0–100.0)
Platelets: 216 10*3/uL (ref 150–400)
RBC: 4.58 MIL/uL (ref 3.87–5.11)
RDW: 14.6 % (ref 11.5–15.5)
WBC: 6.1 10*3/uL (ref 4.0–10.5)

## 2016-07-28 LAB — LIPID PANEL
CHOL/HDL RATIO: 2.8 ratio
CHOLESTEROL: 174 mg/dL (ref 0–200)
HDL: 62 mg/dL (ref >40)
LDL Cholesterol: 94 mg/dL (ref 0–99)
Triglycerides: 90 mg/dL (ref <150)
VLDL: 18 mg/dL (ref 0–40)

## 2016-07-28 LAB — PROTIME-INR
INR: 2.06
PROTHROMBIN TIME: 23.6 s — AB (ref 11.4–15.2)

## 2016-07-28 LAB — HIV ANTIBODY (ROUTINE TESTING W REFLEX): HIV SCREEN 4TH GENERATION: NONREACTIVE

## 2016-07-28 LAB — MAGNESIUM: MAGNESIUM: 1.8 mg/dL (ref 1.7–2.4)

## 2016-07-28 LAB — TROPONIN I
Troponin I: <0.03 ng/mL (ref <0.03)
Troponin I: <0.03 ng/mL (ref <0.03)

## 2016-07-28 MED ORDER — IOPAMIDOL (ISOVUE-370) INJECTION 76%
INTRAVENOUS | Status: AC
  Administered 2016-07-28: 80 mL
  Filled 2016-07-28: qty 100

## 2016-07-28 MED ORDER — NITROGLYCERIN 0.4 MG SL SUBL
SUBLINGUAL_TABLET | SUBLINGUAL | Status: AC
  Filled 2016-07-28: qty 2

## 2016-07-28 MED ORDER — WARFARIN - PHARMACIST DOSING INPATIENT: Freq: Every day | Status: DC

## 2016-07-28 MED ORDER — GI COCKTAIL ~~LOC~~: 30.0000 mL | Freq: Three times a day (TID) | ORAL | Status: DC | PRN

## 2016-07-28 MED ORDER — NITROGLYCERIN 0.4 MG SL SUBL
0.8000 mg | SUBLINGUAL_TABLET | Freq: Once | SUBLINGUAL | Status: AC
  Administered 2016-07-28: 0.8 mg via SUBLINGUAL

## 2016-07-28 MED ORDER — WARFARIN SODIUM 5 MG PO TABS
5.0000 mg | ORAL_TABLET | Freq: Every day | ORAL | Status: DC
  Administered 2016-07-28 – 2016-07-29 (×2): 5 mg via ORAL
  Filled 2016-07-28 (×3): qty 1

## 2016-07-28 MED ORDER — MAGNESIUM SULFATE IN D5W 1-5 GM/100ML-% IV SOLN
1.0000 g | Freq: Once | INTRAVENOUS | Status: AC
  Administered 2016-07-28: 1 g via INTRAVENOUS
  Filled 2016-07-28: qty 100

## 2016-07-28 NOTE — Progress Notes (Signed)
ANTICOAGULATION CONSULT NOTE - Initial Consult  Pharmacy Consult for Coumadin Indication: h/o DVT  Allergies  Allergen Reactions  . Aspirin Other (See Comments)    DRUG INTERACTION: Patient on Coumadin  . Latex Rash  . Motrin [Ibuprofen] Rash    Patient Measurements: Height: 5' (152.4 cm) Weight: 151 lb (68.5 kg) IBW/kg (Calculated) : 45.5 Vital Signs: Temp: 97.8 F (36.6 C) (07/09 2231) Temp Source: Oral (07/09 2231) BP: 143/92 (07/09 2231) Pulse Rate: 58 (07/09 2231)  Labs:  Recent Labs  07/27/16 1705 07/27/16 2252  HGB 13.3  --   HCT 41.1  --   PLT 235  --   LABPROT 22.0*  --   INR 1.89  --   CREATININE 0.78  --   TROPONINI  --  <0.03    Estimated Creatinine Clearance: 69.4 mL/min (by C-G formula based on SCr of 0.78 mg/dL).   Medical History: Past Medical History:  Diagnosis Date  . Clotting disorder ([place])    RH negative 8  . Deep vein thrombosis (DVT) ([facility])    several over the years  . Depression    not taking medicine   . Dizziness   . Family history of anesthesia complication    family hx nausea and vomiting  . Fibroid   . GERD (gastroesophageal reflux disease)   . Headache   . Hypothyroidism   . Numbness in both hands    tendonitis  . PONV (postoperative nausea and vomiting)    difficulty waking up   . Thyroid disease     Medications:  Prescriptions Prior to Admission  Medication Sig Dispense Refill Last Dose  . levothyroxine (SYNTHROID, LEVOTHROID) 175 MCG tablet Take 175 mcg by mouth daily.   07/27/2016 at 0700  . warfarin (COUMADIN) 5 MG tablet Take 5 mg by mouth daily.   07/27/2016 at 0700  . oseltamivir (TAMIFLU) 75 MG capsule Take 1 capsule (75 mg total) by mouth every 12 (twelve) hours. (Patient not taking: Reported on 07/27/2016) 10 capsule 0 Not Taking at Unknown time    Assessment: 56 y.o. female admitted with chest pain, h/o DVT, top continue Coumadin Goal of Therapy:  INR 2-3 Monitor platelets by anticoagulation protocol:  Yes   Plan:  F/U daily INR  Caera Enwright, Bronson Curb 07/28/2016,12:58 AM

## 2016-07-28 NOTE — Progress Notes (Signed)
ANTICOAGULATION CONSULT NOTE - Follow Up Consult  Pharmacy Consult for warfarin Indication: DVT  Allergies  Allergen Reactions  . Aspirin Other (See Comments)    DRUG INTERACTION: Patient on Coumadin  . Latex Rash  . Motrin [Ibuprofen] Rash    Patient Measurements: Height: 5' (152.4 cm) Weight: 151 lb (68.5 kg) IBW/kg (Calculated) : 45.5   Vital Signs: Temp: 97.9 F (36.6 C) (07/10 0627) Temp Source: Oral (07/10 0627) BP: 101/65 (07/10 0627) Pulse Rate: 73 (07/10 0627)  Labs:  Recent Labs  07/27/16 1705 07/27/16 2252 07/28/16 0408  HGB 13.3  --  12.1  HCT 41.1  --  37.8  PLT 235  --  216  LABPROT 22.0*  --  23.6*  INR 1.89  --  2.06  CREATININE 0.78  --  0.85  TROPONINI  --  <0.03 <0.03    Estimated Creatinine Clearance: 65.3 mL/min (by C-G formula based on SCr of 0.85 mg/dL).   Medications:  Scheduled:  . enoxaparin (LOVENOX) injection  40 mg Subcutaneous Q24H  . levothyroxine  175 mcg Oral QAC breakfast  . pantoprazole  40 mg Oral Daily  . sodium chloride flush  3 mL Intravenous Q12H  . Warfarin - Pharmacist Dosing Inpatient   Does not apply q1800    Assessment: 56 y/o F with history of multiple DVTs, on warfarin x 20 years, admitted for chest pain evaluation. EKG, troponins, D-dimer negative. INR subtherapeutic (1.89) at admission, prophylactic lovenox indicated until therapeutic INR. This morning INR is 2.06, appropriate to discontinue lovenox.  Goal of Therapy:  INR 2-3 Monitor platelets by anticoagulation protocol: Yes   Plan:  Discontinue lovenox Continue warfarin 5 mg daily Daily INR   Charlene Brooke, PharmD PGY1 [facility] Resident Pager: [phone] 07/28/2016,8:38 AM

## 2016-07-28 NOTE — Progress Notes (Signed)
Coronary CTA with calcium score of 0, normal coronary arteries and normal aorta.  Massively dilated mid to distal esophagus with evidence of prior gastric banding. This may implicate poor emptying through the banded portion of the stomach and correlation is suggested clinically. Calcified right hilar lymph nodes consistent with prior granulomatous disease.  The likely source of her CP is the dilated esophagus.  No further cardiac workup indicated.  Will sign off.

## 2016-07-28 NOTE — Consult Note (Signed)
Cardiology Consult    Patient ID: Laura Molina MRN: [id number], DOB/AGE: August 27, 1960   Admit date: 07/27/2016 Date of Consult: 07/28/2016  Primary Physician: Seward Carol, MD Primary Cardiologist: new - Dr. Radford Pax  Requesting Provider: Dr. Eliseo Squires  Reason for Consult: chest pain  Patient Profile    Laura Molina has a PMH significant for DVT on coumadin, HTN, HLD, pre-DM, and family history of CAD (mother MI at age 54), GERD, and thyroid disease.   Laura Molina is a 56 y.o. female who is being seen today for the evaluation of chest pain at the request of Dr. Eliseo Squires.   Past Medical History   Past Medical History:  Diagnosis Date  . Clotting disorder ([place])    RH negative 8  . Deep vein thrombosis (DVT) ([facility])    several over the years  . Depression    not taking medicine   . Dizziness   . Family history of anesthesia complication    family hx nausea and vomiting  . Fibroid   . GERD (gastroesophageal reflux disease)   . Headache   . Hypothyroidism   . Numbness in both hands    tendonitis  . PONV (postoperative nausea and vomiting)    difficulty waking up   . Thyroid disease     Past Surgical History:  Procedure Laterality Date  . ABDOMINAL HYSTERECTOMY    . CERVICAL DISC ARTHROPLASTY N/A 09/20/2014   Procedure: TOTAL DISC REPLACEMENT C5-6;  Surgeon: Melina Schools, MD;  Location: [facility];  Service: Orthopedics;  Laterality: N/A;  . CESAREAN SECTION     x 3  . CHOLECYSTECTOMY    . COLONOSCOPY WITH PROPOFOL N/A 01/03/2013   Procedure: COLONOSCOPY WITH PROPOFOL;  Surgeon: Garlan Fair, MD;  Location: [facility];  Service: Endoscopy;  Laterality: N/A;  . LAPAROSCOPIC GASTRIC BANDING  04/06/06  . OOPHORECTOMY    . TENDON RELEASE     bilateral  elbow  . TONSILLECTOMY  as child  . ulnar nerve release Right jan 2014  . VENA CAVA FILTER PLACEMENT     left  groin     Allergies  Allergies  Allergen Reactions  . Aspirin Other (See Comments)    DRUG INTERACTION:  Patient on Coumadin  . Latex Rash  . Motrin [Ibuprofen] Rash    History of Present Illness    Laura Molina presented to the [facility] on 07/27/16 with  2-week history of intermittent chest pain. She describes the pain as discomfort but rates it as a 7/10 at its worst. The pain is located in her left chest above her breast and radiates down her left arm and into her left neck and jaw. The chest pain is not associated with activity or with rest, no relieving or aggravating factors and is not associated with SOB, dizziness, palpitations, nausea, vomiting, or feelings of syncope. Over the past 2 weeks, the pain has been increasing and yesterday was associated with shortness of breath, prompting her to come to the ED. She has had chest pain overnight in the hospital, but is currently chest pain free.  On arrival, she was found to be hypokalemic with K at 2.7, which has been an issue in the past. She also states that she has had swelling in her legs when she walks a lot.  She has taken coumadin for 20 years for 4-5 DVTs. She is in life-long anticoagulation.  Inpatient Medications    . enoxaparin (LOVENOX) injection  40 mg Subcutaneous Q24H  . levothyroxine  175 mcg Oral QAC breakfast  . pantoprazole  40 mg Oral Daily  . sodium chloride flush  3 mL Intravenous Q12H  . Warfarin - Pharmacist Dosing Inpatient   Does not apply q1800     Outpatient Medications    Prior to Admission medications   Medication Sig Start Date End Date Taking? Authorizing Provider  levothyroxine (SYNTHROID, LEVOTHROID) 175 MCG tablet Take 175 mcg by mouth daily.   Yes [provider]  warfarin (COUMADIN) 5 MG tablet Take 5 mg by mouth daily.   Yes [provider]  oseltamivir (TAMIFLU) 75 MG capsule Take 1 capsule (75 mg total) by mouth every 12 (twelve) hours. Patient not taking: Reported on 07/27/2016 02/13/16   Robyn Haber, MD     Family History     Family History  Problem Relation Age of Onset  .  Diabetes Mellitus II Mother   . CAD Mother   . Stroke Mother   . Cirrhosis Mother        Hepatitis C  . Diabetes Mellitus II Father   . CAD Father     Social History    Social History   Social History  . Marital status: Married    Spouse name: N/A  . Number of children: N/A  . Years of education: N/A   Occupational History  . Not on file.   Social History Main Topics  . Smoking status: Former Smoker    Packs/day: 1.00    Years: 4.00    Types: Cigarettes    Quit date: 01/19/1993  . Smokeless tobacco: Never Used  . Alcohol use No  . Drug use: No  . Sexual activity: Not on file   Other Topics Concern  . Not on file   Social History Narrative  . No narrative on file     Review of Systems    General:  No chills, fever, night sweats or weight changes.  Cardiovascular:  No chest pain, dyspnea on exertion, edema, orthopnea, palpitations, paroxysmal nocturnal dyspnea. Dermatological: No rash, lesions/masses Respiratory: No cough, dyspnea Urologic: No hematuria, dysuria Abdominal:   No nausea, vomiting, diarrhea, bright red blood per rectum, melena, or hematemesis Neurologic:  No visual changes, wkns, changes in mental status. All other systems reviewed and are otherwise negative except as noted above.  Physical Exam    Blood pressure 101/65, pulse 73, temperature 97.9 F (36.6 C), temperature source Oral, resp. rate 18, height 5' (1.524 m), weight 151 lb (68.5 kg), SpO2 99 %.  General: Pleasant, NAD Psych: Normal affect. Neuro: Alert and oriented X 3. Moves all extremities spontaneously. HEENT: Normal  Neck: Supple without bruits or JVD. Lungs:  Resp regular and unlabored, CTA. Heart: RRR no s3, s4, or murmurs. Abdomen: Soft, non-tender, non-distended, BS + x 4.  Extremities: No clubbing, cyanosis or edema. DP/PT/Radials 2+ and equal bilaterally.  Labs    Troponin [facility] of Care Test)  Recent Labs  07/27/16 2013  TROPIPOC 0.00    Recent Labs   07/27/16 2252 07/28/16 0408  TROPONINI <0.03 <0.03   Lab Results  Component Value Date   WBC 6.1 07/28/2016   HGB 12.1 07/28/2016   HCT 37.8 07/28/2016   MCV 82.5 07/28/2016   PLT 216 07/28/2016    Recent Labs Lab 07/28/16 0408  NA 141  K 3.3*  CL 106  CO2 28  BUN 6  CREATININE 0.85  CALCIUM 9.0  PROT 6.3*  BILITOT 0.5  ALKPHOS 53  ALT 13*  AST 19  GLUCOSE 92   Lab Results  Component Value Date   CHOL 174 07/28/2016   HDL 62 07/28/2016   LDLCALC 94 07/28/2016   TRIG 90 07/28/2016   Lab Results  Component Value Date   DDIMER <0.27 07/27/2016     Radiology Studies    Dg Chest 2 View  Result Date: 07/27/2016 CLINICAL DATA:  Chest pain for 2 weeks EXAM: CHEST  2 VIEW COMPARISON:  November 22, 2013, Jun 11, 2010 FINDINGS: The heart size is normal. Asymmetric prominence of right hilum is stable compared to prior exam of 2012. There is no focal infiltrate, pulmonary edema, or pleural effusion. The visualized skeletal structures are unremarkable. IMPRESSION: No active cardiopulmonary disease. Electronically Signed   By: Abelardo Diesel M.D.   On: 07/27/2016 18:17    ECG & Cardiac Imaging    EKG 07/27/16: NSR with nonspecific ST changes  Echocardiogram 07/28/16: pending  Assessment & Plan    1. Chest pain - troponin x 3 negative - EKG with nonspecific ST changes in majority of leads - D-dimer negative, pt has been on coumadin for DVT - BNP normal at 9.1 The patient has some risk factors for ACS, including pre-DM and family history of heart disease. However, her symptoms seem atypical in nature in the setting of a negative EKG and negative troponin. Will discuss with attending plan for treadmill myoview today. Pt is NPO.   2. Hx of DVT - coumadin for 20 years - INR at admission was 2.06   3. Hypokalemia - K 2.7, now 3.3 - continue with replacement per primary team - may need to discharge on supplement   4. HTN - problem documented in problem list, but no  home medications   5. HLD - problem documented in problem list, but no home statin and lipid panel WNL this admission   6. Previous smoker - quit 23 years ago   Signed, Ledora Bottcher, PA-C 07/28/2016, 7:48 AM [phone]

## 2016-07-28 NOTE — Discharge Instructions (Signed)
Information on my medicine - Coumadin   (Warfarin)  This medication education was reviewed with me or my healthcare representative as part of my discharge preparation.  The pharmacist that spoke with me during my hospital stay was:  Charlton Haws, [facility]  Why was Coumadin prescribed for you? Coumadin was prescribed for you because you have a blood clot or a medical condition that can cause an increased risk of forming blood clots. Blood clots can cause serious health problems by blocking the flow of blood to the heart, lung, or brain. Coumadin can prevent harmful blood clots from forming. As a reminder your indication for Coumadin is:   Blood Clotting Disorder  What test will check on my response to Coumadin? While on Coumadin (warfarin) you will need to have an INR test regularly to ensure that your dose is keeping you in the desired range. The INR (international normalized ratio) number is calculated from the result of the laboratory test called prothrombin time (PT).  If an INR APPOINTMENT HAS NOT ALREADY BEEN MADE FOR YOU please schedule an appointment to have this lab work done by your health care provider within 7 days. Your INR goal is usually a number between:  2 to 3 or your provider may give you a more narrow range like 2-2.5.  Ask your health care provider during an office visit what your goal INR is.  What  do you need to  know  About  COUMADIN? Take Coumadin (warfarin) exactly as prescribed by your healthcare provider about the same time each day.  DO NOT stop taking without talking to the doctor who prescribed the medication.  Stopping without other blood clot prevention medication to take the place of Coumadin may increase your risk of developing a new clot or stroke.  Get refills before you run out.  What do you do if you miss a dose? If you miss a dose, take it as soon as you remember on the same day then continue your regularly scheduled regimen the next day.  Do not take two  doses of Coumadin at the same time.  Important Safety Information A possible side effect of Coumadin (Warfarin) is an increased risk of bleeding. You should call your healthcare provider right away if you experience any of the following: ? Bleeding from an injury or your nose that does not stop. ? Unusual colored urine (red or dark brown) or unusual colored stools (red or black). ? Unusual bruising for unknown reasons. ? A serious fall or if you hit your head (even if there is no bleeding).  Some foods or medicines interact with Coumadin (warfarin) and might alter your response to warfarin. To help avoid this: ? Eat a balanced diet, maintaining a consistent amount of Vitamin K. ? Notify your provider about major diet changes you plan to make. ? Avoid alcohol or limit your intake to 1 drink for women and 2 drinks for men per day. (1 drink is 5 oz. wine, 12 oz. beer, or 1.5 oz. liquor.)  Make sure that ANY health care provider who prescribes medication for you knows that you are taking Coumadin (warfarin).  Also make sure the healthcare provider who is monitoring your Coumadin knows when you have started a new medication including herbals and non-prescription products.  Coumadin (Warfarin)  Major Drug Interactions  Increased Warfarin Effect Decreased Warfarin Effect  Alcohol (large quantities) Antibiotics (esp. Septra/Bactrim, Flagyl, Cipro) Amiodarone (Cordarone) Aspirin (ASA) Cimetidine (Tagamet) Megestrol (Megace) NSAIDs (ibuprofen, naproxen, etc.) Piroxicam (  Feldene) °Propafenone (Rythmol SR) °Propranolol (Inderal) °Isoniazid (INH) °Posaconazole (Noxafil) Barbiturates (Phenobarbital) °Carbamazepine (Tegretol) °Chlordiazepoxide (Librium) °Cholestyramine (Questran) °Griseofulvin °Oral Contraceptives °Rifampin °Sucralfate (Carafate) °Vitamin K  ° °Coumadin® (Warfarin) Major Herbal Interactions  °Increased Warfarin Effect Decreased Warfarin Effect  °Garlic °Ginseng °Ginkgo biloba Coenzyme  Q10 °Green tea °St. John’s wort   ° °Coumadin® (Warfarin) FOOD Interactions  °Eat a consistent number of servings per week of foods HIGH in Vitamin K °(1 serving = ½ cup)  °Collards (cooked, or boiled & drained) °Kale (cooked, or boiled & drained) °Mustard greens (cooked, or boiled & drained) °Parsley *serving size only = ¼ cup °Spinach (cooked, or boiled & drained) °Swiss chard (cooked, or boiled & drained) °Turnip greens (cooked, or boiled & drained)  °Eat a consistent number of servings per week of foods MEDIUM-HIGH in Vitamin K °(1 serving = 1 cup)  °Asparagus (cooked, or boiled & drained) °Broccoli (cooked, boiled & drained, or raw & chopped) °Brussel sprouts (cooked, or boiled & drained) *serving size only = ½ cup °Lettuce, raw (green leaf, endive, romaine) °Spinach, raw °Turnip greens, raw & chopped  ° °These websites have more information on Coumadin (warfarin):  www.coumadin.com; °www.ahrq.gov/consumer/coumadin.htm; ° ° °

## 2016-07-28 NOTE — Progress Notes (Signed)
PROGRESS NOTE    Laura Molina  FOY:[id number] DOB: 23-Feb-1960 DOA: 07/27/2016 PCP: Seward Carol, MD   Outpatient Specialists:     Brief Narrative:  Laura Molina  is a 55 y.o. female, w hx of DVT x4, IVC Filter apparently c/o chest pain, left sided "cramping" radiation to the left arm.  Intermittent for a couple of weeks. Slight dyspnea.  Denies fever, chills, cough, palp, n/v, diarrhea, brbpr.  Nothing appears to make the pain better or worse.  Pt presented to increase dyspnea this evening .   In ED,  CXR negative ,  EKG nsr at 88, nl axis, nl int, no st-t changes c/w ischemia.  Trop I negative  D dimer negative,  INR 1.89 , K=2.7,  Pt will be admitted for w/up of chest pain and hypokalemia.      Assessment & Plan:   Active Problems:   Hypokalemia   History of DVT (deep vein thrombosis)   Hypothyroidism   Chest pain   Chest pain CTA: calcium score of 0, normal coronary arteries and normal aorta.  Massively dilated mid to distal esophagus with evidence of prior gastric banding. This may implicate poor emptying through the banded portion of the stomach and correlation is suggested clinically. Calcified right hilar lymph nodes consistent with prior granulomatous disease. Will need to follow up outpatient with surgeon  Dyspnea?Pox wnl  Cardiac echo r/o pulmonary hypertension PFT as outpatient  Hypokalemia Repleted Recheck in AM   DVT prophylaxis:  Fully anticoagulated   Code Status: Full Code   Family Communication:   Disposition Plan:  Home after echo in AM if labs better   Consultants:    Subjective: Has been under increased stress  Objective: Vitals:   07/27/16 2145 07/27/16 2231 07/27/16 2300 07/28/16 0627  BP: 120/76 (!) 143/92  101/65  Pulse: 61 (!) 58  73  Resp: 18 18    Temp:  97.8 F (36.6 C)  97.9 F (36.6 C)  TempSrc:  Oral  Oral  SpO2: 99% 98%  99%  Weight:   68.5 kg (151 lb)   Height:   5' (1.524 m)     Intake/Output  Summary (Last 24 hours) at 07/28/16 1608 Last data filed at 07/28/16 0600  Gross per 24 hour  Intake                0 ml  Output                0 ml  Net                0 ml   Filed Weights   07/27/16 2300  Weight: 68.5 kg (151 lb)    Examination:  General exam: Appears calm and comfortable  Respiratory system: Clear to auscultation. Respiratory effort normal. Cardiovascular system: S1 & S2 heard, RRR. No JVD, murmurs, rubs, gallops or clicks. No pedal edema. Gastrointestinal system: Abdomen is nondistended, soft and nontender. No organomegaly or masses felt. Normal bowel sounds heard. Central nervous system: Alert and oriented. No focal neurological deficits.     Data Reviewed: I have personally reviewed following labs and imaging studies  CBC:  Recent Labs Lab 07/27/16 1705 07/28/16 0408  WBC 7.1 6.1  HGB 13.3 12.1  HCT 41.1 37.8  MCV 81.5 82.5  PLT 235 767   Basic Metabolic Panel:  Recent Labs Lab 07/27/16 1705 07/28/16 0408  NA 137 141  K 2.7* 3.3*  CL 104 106  CO2 24 28  GLUCOSE  104* 92  BUN 8 6  CREATININE 0.78 0.85  CALCIUM 9.5 9.0  MG  --  1.8   GFR: Estimated Creatinine Clearance: 65.3 mL/min (by C-G formula based on SCr of 0.85 mg/dL). Liver Function Tests:  Recent Labs Lab 07/28/16 0408  AST 19  ALT 13*  ALKPHOS 53  BILITOT 0.5  PROT 6.3*  ALBUMIN 3.5   No results for input(s): LIPASE, AMYLASE in the last 168 hours. No results for input(s): AMMONIA in the last 168 hours. Coagulation Profile:  Recent Labs Lab 07/27/16 1705 07/28/16 0408  INR 1.89 2.06   Cardiac Enzymes:  Recent Labs Lab 07/27/16 2252 07/28/16 0408 07/28/16 1034  TROPONINI <0.03 <0.03 <0.03   BNP (last 3 results) No results for input(s): PROBNP in the last 8760 hours. HbA1C: No results for input(s): HGBA1C in the last 72 hours. CBG: No results for input(s): GLUCAP in the last 168 hours. Lipid Profile:  Recent Labs  07/28/16 0408  CHOL 174  HDL  62  LDLCALC 94  TRIG 90  CHOLHDL 2.8   Thyroid Function Tests: No results for input(s): TSH, T4TOTAL, FREET4, T3FREE, THYROIDAB in the last 72 hours. Anemia Panel: No results for input(s): VITAMINB12, FOLATE, FERRITIN, TIBC, IRON, RETICCTPCT in the last 72 hours. Urine analysis:    Component Value Date/Time   COLORURINE YELLOW 11/22/2013 2014   APPEARANCEUR CLEAR 11/22/2013 2014   LABSPEC 1.004 (L) 11/22/2013 2014   PHURINE 7.0 11/22/2013 2014   [place] NEGATIVE 11/22/2013 2014   HGBUR NEGATIVE 11/22/2013 2014   [place] NEGATIVE 11/22/2013 2014   [place] NEGATIVE 11/22/2013 2014   PROTEINUR NEGATIVE 11/22/2013 2014   UROBILINOGEN 0.2 11/22/2013 2014   NITRITE NEGATIVE 11/22/2013 2014   LEUKOCYTESUR SMALL (A) 11/22/2013 2014     )No results found for this or any previous visit (from the past 240 hour(s)).    Anti-infectives    None       Radiology Studies: Dg Chest 2 View  Result Date: 07/27/2016 CLINICAL DATA:  Chest pain for 2 weeks EXAM: CHEST  2 VIEW COMPARISON:  November 22, 2013, Jun 11, 2010 FINDINGS: The heart size is normal. Asymmetric prominence of right hilum is stable compared to prior exam of 2012. There is no focal infiltrate, pulmonary edema, or pleural effusion. The visualized skeletal structures are unremarkable. IMPRESSION: No active cardiopulmonary disease. Electronically Signed   By: Abelardo Diesel M.D.   On: 07/27/2016 18:17   Ct Coronary Morph W/cta Cor W/score W/ca W/cm &/or Wo/cm  Addendum Date: 07/28/2016   ADDENDUM REPORT: 07/28/2016 14:56 CLINICAL DATA:  Chest pain EXAM: Cardiac CTA MEDICATIONS: Sub lingual nitro. 4mg  TECHNIQUE: The patient was scanned on a Siemens Force 209 slice scanner. Gantry rotation speed was 250 msecs. Collimation was.6 mm . A 100 kV prospective scan was triggered in the ascending thoracic aorta at 140 HU's . Average HR during the scan was 80 bpm. The 3D data set was interpreted on a dedicated work station using MPR, MIP  and VRT modes. A total of 80cc of contrast was used. FINDINGS: Non-cardiac: See separate report from [facility]. No significant findings on limited lung and soft tissue windows. Calcium Score:  0 Coronary Arteries:Co- dominant with no anomalies LM:  Normal LAD:  Normal D1:  Normal D2:  Normal Circumflex:  Normal OM1:  Normal OM2:  Normal OM3/PDA:  Normal RCA:  Co dominant normal PDA:  Normal PLA:  Normal IMPRESSION: 1) Normal co-dominant coronary arteries 2) Calcium score 0 3) Normal aortic  root 3.3 cm 4) See radiology report Suspect source of chest pain is massively dilated retrocardiac esophagus likely complication from previous lap band surgery [place] Electronically Signed   By: [place] Rouge M.D.   On: 07/28/2016 14:56   Result Date: 07/28/2016 EXAM: OVER-READ INTERPRETATION  CT CHEST The following report is an over-read performed by radiologist Dr. Barnetta Hammersmith [facility] on 07/28/2016. This over-read does not include interpretation of cardiac or coronary anatomy or pathology. The coronary calcium score/coronary CTA interpretation by the cardiologist is attached. COMPARISON:  Chest x-ray on 07/27/2016 FINDINGS: Cardiovascular: No significant vascular findings. Normal heart size. No pericardial effusion. Mediastinum/Nodes: The visualized mid to distal esophagus is massively dilated and contains fluid. The dilated esophagus measures up to 5.9 cm in transverse width. No mediastinal lymphadenopathy identified. Multiple calcified lymph nodes in the right hilum are consistent with prior granulomatous disease. Lungs/Pleura: Visualized lungs show no evidence of infiltrates or nodules. No edema or pleural fluid identified. Upper Abdomen: Gastric band present. The presence of significant esophageal dilatation proximal to the gastric band may implicate poor emptying through the banded portion of the stomach. No obvious signs of band erosion. The entire band and stomach are not  visualized. Musculoskeletal: No chest wall mass or suspicious bone lesions identified. IMPRESSION: 1. Massively dilated mid to distal esophagus with evidence of prior gastric banding. This may implicate poor emptying through the banded portion of the stomach and correlation is suggested clinically. 2. Calcified right hilar lymph nodes consistent with prior granulomatous disease. Electronically Signed: By: Aletta Edouard M.D. On: 07/28/2016 14:46        Scheduled Meds: . levothyroxine  175 mcg Oral QAC breakfast  . nitroGLYCERIN      . pantoprazole  40 mg Oral Daily  . sodium chloride flush  3 mL Intravenous Q12H  . warfarin  5 mg Oral Daily  . Warfarin - Pharmacist Dosing Inpatient   Does not apply q1800   Continuous Infusions:   LOS: 0 days    Time spent: 25 min    Marin, DO [facility] Pager [phone]  If 7PM-7AM, please contact night-coverage [email or web address] Password [email or web address] 07/28/2016, 4:08 PM

## 2016-07-28 NOTE — Care Management Note (Signed)
Case Management Note  Patient Details  Name: Laura Molina MRN: [id number] Date of Birth: [birth year]  Subjective/Objective: Pt presented for Chest Pain-plan for Myoview. Pt from home and plan will be to return home once stable.                   Action/Plan: No home needs identified at this time.   Expected Discharge Date:                  Expected Discharge Plan:  Home/Self Care  In-House Referral:  NA  Discharge planning Services  CM Consult  Post Acute Care Choice:  NA Choice offered to:  NA  DME Arranged:  N/A DME Agency:  NA  HH Arranged:  NA HH Agency:  NA  Status of Service:  Completed, signed off  If discussed at [place] of Stay Meetings, dates discussed:    Additional Comments:  Bethena Roys, RN 07/28/2016, 10:19 AM

## 2016-07-28 NOTE — Progress Notes (Signed)
Cardiologist notified that per pt she spoke with her mom who had similar symptoms. 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 Hoover Brunette, RN

## 2016-07-29 ENCOUNTER — Observation Stay (HOSPITAL_BASED_OUTPATIENT_CLINIC_OR_DEPARTMENT_OTHER): Payer: Managed Care, Other (non HMO)

## 2016-07-29 DIAGNOSIS — E039 Hypothyroidism, unspecified: Secondary | ICD-10-CM | POA: Diagnosis not present

## 2016-07-29 DIAGNOSIS — E876 Hypokalemia: Secondary | ICD-10-CM | POA: Diagnosis not present

## 2016-07-29 DIAGNOSIS — R072 Precordial pain: Secondary | ICD-10-CM | POA: Diagnosis not present

## 2016-07-29 DIAGNOSIS — Z9884 Bariatric surgery status: Secondary | ICD-10-CM | POA: Diagnosis not present

## 2016-07-29 DIAGNOSIS — R079 Chest pain, unspecified: Secondary | ICD-10-CM | POA: Diagnosis not present

## 2016-07-29 LAB — BASIC METABOLIC PANEL
ANION GAP: 7 (ref 5–15)
BUN: 5 mg/dL — ABNORMAL LOW (ref 6–20)
CHLORIDE: 105 mmol/L (ref 101–111)
CO2: 27 mmol/L (ref 22–32)
Calcium: 8.9 mg/dL (ref 8.9–10.3)
Creatinine, Ser: 0.79 mg/dL (ref 0.44–1.00)
Glucose, Bld: 100 mg/dL — ABNORMAL HIGH (ref 65–99)
POTASSIUM: 3.2 mmol/L — AB (ref 3.5–5.1)
SODIUM: 139 mmol/L (ref 135–145)

## 2016-07-29 LAB — CBC
HCT: 37.8 % (ref 36.0–46.0)
HEMOGLOBIN: 12.3 g/dL (ref 12.0–15.0)
MCH: 26.6 pg (ref 26.0–34.0)
MCHC: 32.5 g/dL (ref 30.0–36.0)
MCV: 81.6 fL (ref 78.0–100.0)
PLATELETS: 225 10*3/uL (ref 150–400)
RBC: 4.63 MIL/uL (ref 3.87–5.11)
RDW: 14.5 % (ref 11.5–15.5)
WBC: 6 10*3/uL (ref 4.0–10.5)

## 2016-07-29 LAB — ECHOCARDIOGRAM COMPLETE
Height: 60 in
Weight: 2377.6 [oz_av]

## 2016-07-29 LAB — HEMOGLOBIN A1C
Hgb A1c MFr Bld: 5.5 % (ref 4.8–5.6)
Mean Plasma Glucose: 111 mg/dL

## 2016-07-29 LAB — PROTIME-INR
INR: 1.9
PROTHROMBIN TIME: 22 s — AB (ref 11.4–15.2)

## 2016-07-29 MED ORDER — POTASSIUM CHLORIDE CRYS ER 20 MEQ PO TBCR
40.0000 meq | EXTENDED_RELEASE_TABLET | Freq: Once | ORAL | Status: AC
  Administered 2016-07-29: 40 meq via ORAL
  Filled 2016-07-29: qty 2

## 2016-07-29 MED ORDER — POTASSIUM CHLORIDE ER 10 MEQ PO TBCR: 20.0000 meq | EXTENDED_RELEASE_TABLET | Freq: Every day | ORAL | 0 refills | Status: DC

## 2016-07-29 MED ORDER — PERFLUTREN LIPID MICROSPHERE
1.0000 mL | INTRAVENOUS | Status: AC | PRN
  Administered 2016-07-29: 2 mL via INTRAVENOUS
  Filled 2016-07-29: qty 10

## 2016-07-29 MED ORDER — PANTOPRAZOLE SODIUM 40 MG PO TBEC: 40.0000 mg | DELAYED_RELEASE_TABLET | Freq: Every day | ORAL | 0 refills | Status: DC

## 2016-07-29 MED ORDER — WARFARIN SODIUM 2.5 MG PO TABS
2.5000 mg | ORAL_TABLET | Freq: Once | ORAL | Status: AC
  Administered 2016-07-29: 2.5 mg via ORAL

## 2016-07-29 NOTE — Progress Notes (Signed)
ANTICOAGULATION CONSULT NOTE - Follow Up Consult  Pharmacy Consult for warfarin Indication: DVT  Allergies  Allergen Reactions  . Aspirin Other (See Comments)    DRUG INTERACTION: Patient on Coumadin  . Latex Rash  . Motrin [Ibuprofen] Rash    Patient Measurements: Height: 5' (152.4 cm) Weight: 148 lb 9.6 oz (67.4 kg) IBW/kg (Calculated) : 45.5   Vital Signs: Temp: 97 F (36.1 C) (07/11 0558) Temp Source: Oral (07/11 0558) BP: 99/66 (07/11 0558) Pulse Rate: 65 (07/11 0558)  Labs:  Recent Labs  07/27/16 1705 07/27/16 2252 07/28/16 0408 07/28/16 1034 07/29/16 0228  HGB 13.3  --  12.1  --  12.3  HCT 41.1  --  37.8  --  37.8  PLT 235  --  216  --  225  LABPROT 22.0*  --  23.6*  --  22.0*  INR 1.89  --  2.06  --  1.90  CREATININE 0.78  --  0.85  --  0.79  TROPONINI  --  <0.03 <0.03 <0.03  --     Estimated Creatinine Clearance: 68.9 mL/min (by C-G formula based on SCr of 0.79 mg/dL).   Medications:  Scheduled:  . levothyroxine  175 mcg Oral QAC breakfast  . pantoprazole  40 mg Oral Daily  . sodium chloride flush  3 mL Intravenous Q12H  . warfarin  2.5 mg Oral Once  . warfarin  5 mg Oral Daily  . Warfarin - Pharmacist Dosing Inpatient   Does not apply q1800    Assessment: 56 y/o F with history of multiple DVTs, on warfarin x 20 years, admitted for chest pain evaluation. EKG, troponins, D-dimer negative. INR subtherapeutic (1.89) at admission. Pt has been receiving home dose of 5 mg daily and her INR was subtherapeutic again at 1.90. Hgb is stable at 12.3, PLTs 225, and no bleeding noted in chart.  Goal of Therapy:  INR 2-3 Monitor platelets by anticoagulation protocol: Yes   Plan:  Warfarin 7.5 mg x 1 (5mg  + 2.5mg  due to morning dosing) Daily INR and CBC Monitor for S/Sx of bleeding   Patterson Hammersmith PharmD PGY1 Pharmacy Practice Resident 07/29/2016 1:14 PM Pager: [phone]

## 2016-07-29 NOTE — Progress Notes (Signed)
  Echocardiogram 2D Echocardiogram has been performed.  Matilde Bash 07/29/2016, 9:22 AM

## 2016-07-29 NOTE — Progress Notes (Signed)
Patient received all discharge information and education. She verbalized understanding. Patient will also be driving home, Dr. Eliseo Squires placed the order.

## 2016-07-29 NOTE — Discharge Summary (Signed)
Physician Discharge Summary  Genesis Novosad FGH:[id number] DOB: 1960/07/15 DOA: 07/27/2016  PCP: Seward Carol, MD  Admit date: 07/27/2016 Discharge date: 07/29/2016   Recommendations for Outpatient Follow-Up:   1. Close follow up with surgeon who did lap band-- suspect may need EGD/change in band 2. BMP 1 week 3. outpatient PFTs  Discharge Diagnosis:   Active Problems:   Hypokalemia   History of DVT (deep vein thrombosis)   Hypothyroidism   Chest pain   Discharge disposition:  Home.   Discharge Condition: Improved.  Diet recommendation:   Wound care: None.   History of Present Illness:   Laura Molina  is a 56 y.o. female, w hx of DVT x4, IVC Filter apparently c/o chest pain, left sided "cramping" radiation to the left arm.  Intermittent for a couple of weeks. Slight dyspnea.  Denies fever, chills, cough, palp, n/v, diarrhea, brbpr.  Nothing appears to make the pain better or worse.  Pt presented to increase dyspnea this evening .   In ED,  CXR negative ,  EKG nsr at 88, nl axis, nl int, no st-t changes c/w ischemia.  Trop I negative  D dimer negative,  INR 1.89 , K=2.7,  Pt will be admitted for w/up of chest pain and hypokalemia   Hospital Course by Problem:   Chest pain CTA: calcium score of 0, normal coronary arteries and normal aorta. Massively dilated mid to distal esophagus with evidence of prior gastric banding. This may implicate poor emptying through the banded portion of the stomach and correlation is suggested clinically. Calcified right hilar lymph nodes consistent with prior granulomatous disease. Will need to follow up outpatient with surgeon  Dyspnea?Pox wnl  Echo done: Left ventricle: The cavity size was normal. Systolic function was   normal. The estimated ejection fraction was in the range of 60%   to 65%. Wall motion was normal; there were no regional wall   motion abnormalities. Left ventricular diastolic function   parameters were  normal. - Atrial septum: No defect or patent foramen ovale was identified. - Impressions: Dilated esophagus posterior to LA. - Dilated esophagus posterior to LA PFT as outpatient  Hypokalemia Repleted -placed on daily supplement- outpatient follow up    Medical Consultants:   cards  Discharge Exam:   Vitals:   07/28/16 2154 07/29/16 0558  BP: 112/84 99/66  Pulse: 65 65  Resp: 18 18  Temp: 98 F (36.7 C) (!) 97 F (36.1 C)   Vitals:   07/27/16 2300 07/28/16 0627 07/28/16 2154 07/29/16 0558  BP:  101/65 112/84 99/66  Pulse:  73 65 65  Resp:   18 18  Temp:  97.9 F (36.6 C) 98 F (36.7 C) (!) 97 F (36.1 C)  TempSrc:  Oral Oral Oral  SpO2:  99% 99% 100%  Weight: 68.5 kg (151 lb)   67.4 kg (148 lb 9.6 oz)  Height: 5' (1.524 m)       Gen:  NAD- feeling much better   The results of significant diagnostics from this hospitalization (including imaging, microbiology, ancillary and laboratory) are listed below for reference.     Procedures and Diagnostic Studies:   Dg Chest 2 View  Result Date: 07/27/2016 CLINICAL DATA:  Chest pain for 2 weeks EXAM: CHEST  2 VIEW COMPARISON:  November 22, 2013, Jun 11, 2010 FINDINGS: The heart size is normal. Asymmetric prominence of right hilum is stable compared to prior exam of 2012. There is no focal infiltrate, pulmonary edema, or pleural  effusion. The visualized skeletal structures are unremarkable. IMPRESSION: No active cardiopulmonary disease. Electronically Signed   By: Abelardo Diesel M.D.   On: 07/27/2016 18:17   Ct Coronary Morph W/cta Cor W/score W/ca W/cm &/or Wo/cm  Addendum Date: 07/28/2016   ADDENDUM REPORT: 07/28/2016 14:56 CLINICAL DATA:  Chest pain EXAM: Cardiac CTA MEDICATIONS: Sub lingual nitro. 4mg  TECHNIQUE: The patient was scanned on a Siemens Force 009 slice scanner. Gantry rotation speed was 250 msecs. Collimation was.6 mm . A 100 kV prospective scan was triggered in the ascending thoracic aorta at 140 HU's .  Average HR during the scan was 80 bpm. The 3D data set was interpreted on a dedicated work station using MPR, MIP and VRT modes. A total of 80cc of contrast was used. FINDINGS: Non-cardiac: See separate report from [facility]. No significant findings on limited lung and soft tissue windows. Calcium Score:  0 Coronary Arteries:Co- dominant with no anomalies LM:  Normal LAD:  Normal D1:  Normal D2:  Normal Circumflex:  Normal OM1:  Normal OM2:  Normal OM3/PDA:  Normal RCA:  Co dominant normal PDA:  Normal PLA:  Normal IMPRESSION: 1) Normal co-dominant coronary arteries 2) Calcium score 0 3) Normal aortic root 3.3 cm 4) See radiology report Suspect source of chest pain is massively dilated retrocardiac esophagus likely complication from previous lap band surgery [place] Electronically Signed   By: [place] M.D.   On: 07/28/2016 14:56   Result Date: 07/28/2016 EXAM: OVER-READ INTERPRETATION  CT CHEST The following report is an over-read performed by radiologist Dr. Barnetta Hammersmith [facility] on 07/28/2016. This over-read does not include interpretation of cardiac or coronary anatomy or pathology. The coronary calcium score/coronary CTA interpretation by the cardiologist is attached. COMPARISON:  Chest x-ray on 07/27/2016 FINDINGS: Cardiovascular: No significant vascular findings. Normal heart size. No pericardial effusion. Mediastinum/Nodes: The visualized mid to distal esophagus is massively dilated and contains fluid. The dilated esophagus measures up to 5.9 cm in transverse width. No mediastinal lymphadenopathy identified. Multiple calcified lymph nodes in the right hilum are consistent with prior granulomatous disease. Lungs/Pleura: Visualized lungs show no evidence of infiltrates or nodules. No edema or pleural fluid identified. Upper Abdomen: Gastric band present. The presence of significant esophageal dilatation proximal to the gastric band may implicate poor emptying through  the banded portion of the stomach. No obvious signs of band erosion. The entire band and stomach are not visualized. Musculoskeletal: No chest wall mass or suspicious bone lesions identified. IMPRESSION: 1. Massively dilated mid to distal esophagus with evidence of prior gastric banding. This may implicate poor emptying through the banded portion of the stomach and correlation is suggested clinically. 2. Calcified right hilar lymph nodes consistent with prior granulomatous disease. Electronically Signed: By: Aletta Edouard M.D. On: 07/28/2016 14:46     Labs:   Basic Metabolic Panel:  Recent Labs Lab 07/27/16 1705 07/28/16 0408 07/29/16 0228  NA 137 141 139  K 2.7* 3.3* 3.2*  CL 104 106 105  CO2 24 28 27   GLUCOSE 104* 92 100*  BUN 8 6 5*  CREATININE 0.78 0.85 0.79  CALCIUM 9.5 9.0 8.9  MG  --  1.8  --    GFR Estimated Creatinine Clearance: 68.9 mL/min (by C-G formula based on SCr of 0.79 mg/dL). Liver Function Tests:  Recent Labs Lab 07/28/16 0408  AST 19  ALT 13*  ALKPHOS 53  BILITOT 0.5  PROT 6.3*  ALBUMIN 3.5   No results for input(s):  LIPASE, AMYLASE in the last 168 hours. No results for input(s): AMMONIA in the last 168 hours. Coagulation profile  Recent Labs Lab 07/27/16 1705 07/28/16 0408 07/29/16 0228  INR 1.89 2.06 1.90    CBC:  Recent Labs Lab 07/27/16 1705 07/28/16 0408 07/29/16 0228  WBC 7.1 6.1 6.0  HGB 13.3 12.1 12.3  HCT 41.1 37.8 37.8  MCV 81.5 82.5 81.6  PLT 235 216 225   Cardiac Enzymes:  Recent Labs Lab 07/27/16 2252 07/28/16 0408 07/28/16 1034  TROPONINI <0.03 <0.03 <0.03   BNP: Invalid input(s): POCBNP CBG: No results for input(s): GLUCAP in the last 168 hours. D-Dimer  Recent Labs  07/27/16 1705  DDIMER <0.27   Hgb A1c  Recent Labs  07/28/16 0408  HGBA1C 5.5   Lipid Profile  Recent Labs  07/28/16 0408  CHOL 174  HDL 62  LDLCALC 94  TRIG 90  CHOLHDL 2.8   Thyroid function studies No results for  input(s): TSH, T4TOTAL, T3FREE, THYROIDAB in the last 72 hours.  Invalid input(s): FREET3 Anemia work up No results for input(s): VITAMINB12, FOLATE, FERRITIN, TIBC, IRON, RETICCTPCT in the last 72 hours. Microbiology No results found for this or any previous visit (from the past 240 hour(s)).   Discharge Instructions:   Discharge Instructions    Diet general    Complete by:  As directed    Discharge instructions    Complete by:  As directed    BMP 1-2 weeks Close follow up with band surgeon   Increase activity slowly    Complete by:  As directed      Allergies as of 07/29/2016      Reactions   Aspirin Other (See Comments)   DRUG INTERACTION: Patient on Coumadin   Latex Rash   Motrin [ibuprofen] Rash      Medication List    STOP taking these medications   oseltamivir 75 MG capsule Commonly known as:  TAMIFLU     TAKE these medications   levothyroxine 175 MCG tablet Commonly known as:  SYNTHROID, LEVOTHROID Take 175 mcg by mouth daily.   pantoprazole 40 MG tablet Commonly known as:  PROTONIX Take 1 tablet (40 mg total) by mouth daily. Start taking on:  07/30/2016   potassium chloride 10 MEQ tablet Commonly known as:  K-DUR Take 2 tablets (20 mEq total) by mouth daily.   warfarin 5 MG tablet Commonly known as:  COUMADIN Take 5 mg by mouth daily.      Follow-up Information    Seward Carol, MD Follow up in 1 week(s).   Specialty:  Internal Medicine Why:  follow up on K Contact information: [address] [place] [address] [phone]        follow up with lap band surgeon Follow up.            Time coordinating discharge: 35 min  Signed:  Serina Nichter U Rolin Schult   [facility] 07/29/2016, 1:17 PM

## 2016-11-16 ENCOUNTER — Other Ambulatory Visit: Payer: Self-pay | Admitting: Internal Medicine

## 2016-11-16 DIAGNOSIS — Z1231 Encounter for screening mammogram for malignant neoplasm of breast: Secondary | ICD-10-CM

## 2016-12-14 ENCOUNTER — Ambulatory Visit
Admission: RE | Admit: 2016-12-14 | Discharge: 2016-12-14 | Disposition: A | Discharge status: Home / Self Care | Payer: Managed Care, Other (non HMO) | Source: Ambulatory Visit | Attending: Internal Medicine | Admitting: Internal Medicine

## 2016-12-14 DIAGNOSIS — Z1231 Encounter for screening mammogram for malignant neoplasm of breast: Secondary | ICD-10-CM

## 2017-08-18 ENCOUNTER — Ambulatory Visit (INDEPENDENT_AMBULATORY_CARE_PROVIDER_SITE_OTHER): Payer: Managed Care, Other (non HMO) | Admitting: Family Medicine

## 2017-08-18 ENCOUNTER — Encounter: Payer: Self-pay | Admitting: Neurology

## 2017-08-18 ENCOUNTER — Other Ambulatory Visit: Payer: Self-pay | Admitting: *Deleted

## 2017-08-18 ENCOUNTER — Encounter: Payer: Self-pay | Admitting: Family Medicine

## 2017-08-18 VITALS — BP 124/92 | HR 48 | Ht 60.0 in | Wt 148.0 lb

## 2017-08-18 DIAGNOSIS — M5412 Radiculopathy, cervical region: Secondary | ICD-10-CM

## 2017-08-18 DIAGNOSIS — G5601 Carpal tunnel syndrome, right upper limb: Secondary | ICD-10-CM | POA: Diagnosis not present

## 2017-08-18 DIAGNOSIS — M4802 Spinal stenosis, cervical region: Secondary | ICD-10-CM | POA: Diagnosis not present

## 2017-08-18 MED ORDER — GABAPENTIN 100 MG PO CAPS: 200.0000 mg | ORAL_CAPSULE | Freq: Every day | ORAL | 3 refills | Status: DC

## 2017-08-18 MED ORDER — VITAMIN D (ERGOCALCIFEROL) 1.25 MG (50000 UNIT) PO CAPS: 50000.0000 [IU] | ORAL_CAPSULE | ORAL | 0 refills | Status: AC

## 2017-08-18 NOTE — Patient Instructions (Signed)
Good to see you  Laura Molina is your friend.  Wear brace day and night for a week Injected the wrist to see how it goes.  We also will order the epidural  Once weekly vitamin D for 12 weeks Gabapentin 200mg  at night See me again in 2 weeks after epidural

## 2017-08-18 NOTE — Assessment & Plan Note (Signed)
Carpal tunnel injected today.  Brace given, home exercise given.  EMG ordered to further evaluate if this is more from a recurrent carpal tunnel versus patient's cervical spine.  I believe that it was most likely be the latter.  Gabapentin given may need further work-up including laboratory work-up, as well as referral to neurology continued to have difficulty.  Do believe it is in the neck and would consider neurosurgery referral.

## 2017-08-18 NOTE — Progress Notes (Signed)
Be

## 2017-08-18 NOTE — Assessment & Plan Note (Signed)
Degenerative cervical spinal stenosis.  Discussed icing regimen and home exercises.  Discussed which activities to do patient is having worsening weakness of the upper extremities right greater than left.  Severe weakness noted in the C5-C6 distribution on the right side where patient does have a disc replacement.  There was no significant displacement on noted on the recent MRI.  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  Epidural ordered today to see how patient responds.  We discussed gabapentin given this.  Patient is having difficulty with working with the moment we will keep her out of work until patient has that done.  Discussed with him once weekly vitamin D for strength and endurance.  Neurology referral for EMG.  Follow-up again in 4 weeks

## 2017-08-18 NOTE — Progress Notes (Signed)
[name] [place], [place] [address] Phone: [phone] Subjective:    I'm seeing this patient by the request  of:  Seward Carol, MD   CC: neck pain   URK:[id number]  Graylyn Bunney is a 57 y.o. female coming in with complaint of neck pain. Dr. Rolena Infante did surgery on patient's neck in 2016, C5-C6, disc replacement. Pain into thoracic spine as well. Numbness into right and left arm. Right worse than left as far as radicular symptoms but the pain is worse in left trap. Does perform administrative work. Does have history of bilateral ulnar nerve release by Dr. Burney Gauze. Pain is constant and with all movements. Does experiencing weakness bilaterally. Having a hard time sleeping due to numbness and pain. Uses Tylenol for pain.   Patient is to follow-up on another MRI.  Was found to have some spinal stenosis and C6-7 noted and adjacent segment disease with worsening arthritis      Past Medical History:  Diagnosis Date  . Clotting disorder ([place])    RH negative 8  . Deep vein thrombosis (DVT) ([facility])    several over the years  . Depression    not taking medicine   . Dizziness   . Family history of anesthesia complication    family hx nausea and vomiting  . Fibroid   . GERD (gastroesophageal reflux disease)   . Headache   . Hypothyroidism   . Numbness in both hands    tendonitis  . PONV (postoperative nausea and vomiting)    difficulty waking up   . Thyroid disease    Past Surgical History:  Procedure Laterality Date  . ABDOMINAL HYSTERECTOMY    . CERVICAL DISC ARTHROPLASTY N/A 09/20/2014   Procedure: TOTAL DISC REPLACEMENT C5-6;  Surgeon: Melina Schools, MD;  Location: [facility];  Service: Orthopedics;  Laterality: N/A;  . CESAREAN SECTION     x 3  . CHOLECYSTECTOMY    . COLONOSCOPY WITH PROPOFOL N/A 01/03/2013   Procedure: COLONOSCOPY WITH PROPOFOL;  Surgeon: Garlan Fair, MD;  Location: [facility];  Service: Endoscopy;   Laterality: N/A;  . LAPAROSCOPIC GASTRIC BANDING  04/06/06  . OOPHORECTOMY    . TENDON RELEASE     bilateral  elbow  . TONSILLECTOMY  as child  . ulnar nerve release Right jan 2014  . VENA CAVA FILTER PLACEMENT     left  groin   Social History   Socioeconomic History  . Marital status: Married    Spouse name: Not on file  . Number of children: Not on file  . Years of education: Not on file  . Highest education level: Not on file  Occupational History  . Not on file  Social Needs  . Financial resource strain: Not on file  . Food insecurity:    Worry: Not on file    Inability: Not on file  . Transportation needs:    Medical: Not on file    Non-medical: Not on file  Tobacco Use  . Smoking status: Former Smoker    Packs/day: 1.00    Years: 4.00    Pack years: 4.00    Types: Cigarettes    Last attempt to quit: 01/19/1993    Years since quitting: 24.5  . Smokeless tobacco: Never Used  Substance and Sexual Activity  . Alcohol use: No  . Drug use: No  . Sexual activity: Not on file  Lifestyle  . Physical activity:    Days per  week: Not on file    Minutes per session: Not on file  . Stress: Not on file  Relationships  . Social connections:    Talks on phone: Not on file    Gets together: Not on file    Attends religious service: Not on file    Active member of club or organization: Not on file    Attends meetings of clubs or organizations: Not on file    Relationship status: Not on file  Other Topics Concern  . Not on file  Social History Narrative  . Not on file   Allergies  Allergen Reactions  . Aspirin Other (See Comments)    DRUG INTERACTION: Patient on Coumadin  . Latex Rash  . Motrin [Ibuprofen] Rash   Family History  Problem Relation Age of Onset  . Diabetes Mellitus II Mother   . CAD Mother   . Stroke Mother   . Cirrhosis Mother        Hepatitis C  . Diabetes Mellitus II Father   . CAD Father      Past medical history, social, surgical and  family history all reviewed in electronic medical record.  No pertanent information unless stated regarding to the chief complaint.   Review of Systems:Review of systems updated and as accurate as of 08/18/17  No , visual changes, nausea, vomiting, diarrhea, constipation, dizziness, abdominal pain, skin rash, fevers, chills, night sweats, weight loss, swollen lymph nodes, body aches, joint swelling, , chest pain, shortness of breath, mood changes.  Positive muscle aches, headaches  Objective  Blood pressure (!) 124/92, pulse (!) 48, height 5' (1.524 m), weight 148 lb (67.1 kg), SpO2 97 %. Systems examined below as of 08/18/17   General: No apparent distress alert and oriented x3 mood and affect normal, dressed appropriately.  HEENT: Pupils equal, extraocular movements intact  Respiratory: Patient's speak in full sentences and does not appear short of breath  Cardiovascular: No lower extremity edema, non tender, no erythema  Skin: Warm dry intact with no signs of infection or rash on extremities or on axial skeleton.  Abdomen: Soft nontender  Neuro: Cranial nerves II through XII are intact, neurovascularly intact in all extremities with 2+ DTRs and 2+ pulses.  Lymph: No lymphadenopathy of posterior or anterior cervical chain or axillae bilaterally.  Gait normal with good balance and coordination.  MSK:  tender with full range of motion and good stability and symmetric strength and tone of shoulders,  hip, knee and ankles bilaterally.  Mild arthritic changes Neck exam shows loss of lordosis.  Patient has severe positive Spurling's especially on the right side.  Significant weakness of the upper extremities bilaterally.  Patient has 3 out of 5 strength in C5 distribution on the right as well as some weakness in all other cervical nerves hyper thenar eminence wasting  Patient does have more pain with compression over the median nerve on the right side  After verbal consent patient was prepped with  alcohol swab and with a 25-gauge half inch needle injected into the right median nerve sheath with 0.5 cc of 0.5% Marcaine and 0.5 cc of Kenalog 40 mg/mL    Impression and Recommendations:     This case required medical decision making of moderate complexity.      Note: This dictation was prepared with Dragon dictation along with smaller phrase technology. Any transcriptional errors that result from this process are unintentional.

## 2017-08-26 ENCOUNTER — Telehealth: Payer: Self-pay

## 2017-08-26 NOTE — Telephone Encounter (Signed)
Spoke with patient that the person who can help with this is out of office until Monday. Let her know that we will be able to get to paperwork next week. Patient said that it was fine.

## 2017-09-06 ENCOUNTER — Ambulatory Visit
Admission: RE | Admit: 2017-09-06 | Discharge: 2017-09-06 | Disposition: A | Discharge status: Home / Self Care | Payer: Managed Care, Other (non HMO) | Source: Ambulatory Visit | Attending: Family Medicine | Admitting: Family Medicine

## 2017-09-06 DIAGNOSIS — M5412 Radiculopathy, cervical region: Secondary | ICD-10-CM

## 2017-09-06 MED ORDER — TRIAMCINOLONE ACETONIDE 40 MG/ML IJ SUSP (RADIOLOGY)
60.0000 mg | Freq: Once | INTRAMUSCULAR | Status: AC
  Administered 2017-09-06: 60 mg via EPIDURAL

## 2017-09-06 MED ORDER — IOPAMIDOL (ISOVUE-M 300) INJECTION 61%
1.0000 mL | Freq: Once | INTRAMUSCULAR | Status: AC | PRN
  Administered 2017-09-06: 1 mL via EPIDURAL

## 2017-09-06 NOTE — Discharge Instructions (Signed)

## 2017-09-07 ENCOUNTER — Ambulatory Visit (INDEPENDENT_AMBULATORY_CARE_PROVIDER_SITE_OTHER): Payer: Managed Care, Other (non HMO) | Admitting: Neurology

## 2017-09-07 DIAGNOSIS — M5412 Radiculopathy, cervical region: Secondary | ICD-10-CM

## 2017-09-07 NOTE — Procedures (Signed)
[facility]  [place], [place]  [place],  [address] Tel: [phone] Fax:  [phone] Test Date:  09/07/2017  Patient: Laura Molina DOB: [birth year] Physician: Narda Amber, DO  Sex: Female Height: 5\' 0"  Ref Phys: Curlene Dolphin, DO  ID#: [id number] Temp: 33.1C Technician:    Patient Complaints: This is a 57 year old female with history of C5-C6 disc replacement and bilateral ulnar release referred for evaluation of bilateral arm pain and paresthesia.  NCV & EMG Findings: Extensive electrodiagnostic testing of the right upper extremity and additional studies of the left shows:  1. Bilateral median, ulnar, and mixed palmar sensory responses are within normal limits. 2. Bilateral median motor responses are within normal limits.  Bilateral ulnar motor responses are mildly symmetrically reduced. 3. There is no evidence of active or chronic motor axon loss changes affecting any of the tested muscles. Motor unit configuration and recruitment pattern is within normal limits.  Impression: 1. 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 2. There is no evidence of carpal tunnel syndrome or a cervical radiculopathy.   ___________________________ Narda Amber, DO    Nerve Conduction Studies Anti Sensory Summary Table   Site NR Peak (ms) Norm Peak (ms) P-T Amp (V) Norm P-T Amp  Left Median Anti Sensory (2nd Digit)  33.1C  Wrist    2.8 <3.6 55.1 >15  Right Median Anti Sensory (2nd Digit)  33.1C  Wrist    2.9 <3.6 51.5 >15  Left Ulnar Anti Sensory (5th Digit)  33.1C  Wrist    2.6 <3.1 50.5 >10  Right Ulnar Anti Sensory (5th Digit)  33.1C  Wrist    2.5 <3.1 45.1 >10   Motor Summary Table   Site NR Onset (ms) Norm Onset (ms) O-P Amp (mV) Norm O-P Amp Site1 Site2 Delta-0 (ms) Dist (cm) Vel (m/s) Norm Vel (m/s)  Left Median Motor (Abd Poll Brev)  33.1C  Wrist    2.6 <4.0 10.4 >6 Elbow Wrist 4.6 24.0 52  >50  Elbow    7.2  9.9         Right Median Motor (Abd Poll Brev)  33.1C  Wrist    2.3 <4.0 8.4 >6 Elbow Wrist 4.5 25.0 56 >50  Elbow    6.8  7.6         Left Ulnar Motor (Abd Dig Minimi)  33.1C  Wrist    1.9 <3.1 6.0 >7 B Elbow Wrist 3.2 20.0 63 >50  B Elbow    5.1  5.7  A Elbow B Elbow 1.6 10.0 62 >50  A Elbow    6.7  5.6         Right Ulnar Motor (Abd Dig Minimi)  33.1C  Wrist    2.0 <3.1 6.5 >7 B Elbow Wrist 3.3 22.0 67 >50  B Elbow    5.3  6.5  A Elbow B Elbow 1.6 10.0 62 >50  A Elbow    6.9  6.5          Comparison Summary Table   Site NR Peak (ms) Norm Peak (ms) P-T Amp (V) Site1 Site2 Delta-P (ms) Norm Delta (ms)  Left Median/Ulnar Palm Comparison (Wrist - 8cm)  33.1C  Median Palm    1.5 <2.2 41.9 Median Palm Ulnar Palm 0.1   Ulnar Palm    1.4 <2.2 16.2      Right Median/Ulnar Palm Comparison (Wrist - 8cm)  33.1C  Median [employer]  1.5 <2.2 27.4 Median Palm Ulnar Palm 0.3   Ulnar Palm    1.2 <2.2 20.8       EMG   Side Muscle Ins Act Fibs Psw Fasc Number Recrt Dur Dur. Amp Amp. Poly Poly. Comment  Right 1stDorInt Nml Nml Nml Nml Nml Nml Nml Nml Nml Nml Nml Nml N/A  Right PronatorTeres Nml Nml Nml Nml Nml Nml Nml Nml Nml Nml Nml Nml N/A  Right Biceps Nml Nml Nml Nml Nml Nml Nml Nml Nml Nml Nml Nml N/A  Right Triceps Nml Nml Nml Nml Nml Nml Nml Nml Nml Nml Nml Nml N/A  Right Deltoid Nml Nml Nml Nml Nml Nml Nml Nml Nml Nml Nml Nml N/A  Left 1stDorInt Nml Nml Nml Nml Nml Nml Nml Nml Nml Nml Nml Nml N/A  Left PronatorTeres Nml Nml Nml Nml Nml Nml Nml Nml Nml Nml Nml Nml N/A  Left Triceps Nml Nml Nml Nml Nml Nml Nml Nml Nml Nml Nml Nml N/A  Left Deltoid Nml Nml Nml Nml Nml Nml Nml Nml Nml Nml Nml Nml N/A  Left Biceps Nml Nml Nml Nml Nml Nml Nml Nml Nml Nml Nml Nml N/A      Waveforms:

## 2017-09-09 ENCOUNTER — Encounter: Payer: Self-pay | Admitting: Family Medicine

## 2017-09-19 NOTE — Progress Notes (Signed)
[name] [place],  [address] Phone: [phone] Subjective:   Laura Molina, am serving as a scribe for Dr. Hulan Saas.  I'm seeing this patient by the request  of:    CC: Neck pain follow-up  [id number]  Laura Molina is a 57 y.o. female coming in with complaint of neck pain. Had epidural which improved her pain. Patient continues to have constant numbness and tingling in right thumb and 4th and 5th fingers and 4th and 5th fingers on the left hand. She Molina longer is having pain in the forearms.  Feels the epidural has been helpful.  Still just feels right thumb.  Feels like she is having significant difficulty still.  Significant fatigue as well.  Still has weakness and has difficulty grabbing things.  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    Past Medical History:  Diagnosis Date  . Clotting disorder ([place])    RH negative 8  . Deep vein thrombosis (DVT) ([facility])    several over the years  . Depression    not taking medicine   . Dizziness   . Family history of anesthesia complication    family hx nausea and vomiting  . Fibroid   . GERD (gastroesophageal reflux disease)   . Headache   . Hypothyroidism   . Numbness in both hands    tendonitis  . PONV (postoperative nausea and vomiting)    difficulty waking up   . Thyroid disease    Past Surgical History:  Procedure Laterality Date  . ABDOMINAL HYSTERECTOMY    . 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 CESAREAN SECTION     x 3  . CHOLECYSTECTOMY    . COLONOSCOPY WITH PROPOFOL N/A 01/03/2013   Procedure: COLONOSCOPY WITH PROPOFOL;  Surgeon: Garlan Fair, MD;  Location: [facility];  Service: Endoscopy;  Laterality: N/A;  . LAPAROSCOPIC  GASTRIC BANDING  04/06/06  . OOPHORECTOMY    . TENDON RELEASE     bilateral  elbow  . TONSILLECTOMY  as child  . ulnar nerve release Right jan 2014  . VENA CAVA FILTER PLACEMENT     left  groin   Social History   Socioeconomic History  . Marital status: Married    Spouse name: Not on file  . Number of children: Not on file  . Years of education: Not on file  . Highest education level: Not on file  Occupational History  . Not on file  Social Needs  . Financial resource strain: Not on file  . Food insecurity:    Worry: Not on file    Inability: Not on file  . Transportation needs:    Medical: Not on file    Non-medical: Not on file  Tobacco Use  . Smoking status: Former Smoker    Packs/day: 1.00    Years: 4.00    Pack years: 4.00    Types: Cigarettes    Last attempt to quit: 01/19/1993    Years since quitting: 24.6  . Smokeless tobacco: Never Used  Substance and Sexual Activity  . Alcohol use: Molina  . Drug use: Molina  . Sexual activity: Not on file  Lifestyle  . Physical activity:    Days per week: Not on file  Minutes per session: Not on file  . Stress: Not on file  Relationships  . Social connections:    Talks on phone: Not on file    Gets together: Not on file    Attends religious service: Not on file    Active member of club or organization: Not on file    Attends meetings of clubs or organizations: Not on file    Relationship status: Not on file  Other Topics Concern  . Not on file  Social History Narrative  . Not on file   Allergies  Allergen Reactions  . Aspirin Other (See Comments)    DRUG INTERACTION: Patient on Coumadin  . Latex Rash  . Motrin [Ibuprofen] Rash   Family History  Problem Relation Age of Onset  . Diabetes Mellitus II Mother   . CAD Mother   . Stroke Mother   . Cirrhosis Mother        Hepatitis C  . Diabetes Mellitus II Father   . CAD Father     Current Outpatient Medications (Endocrine & Metabolic):  .  levothyroxine  (SYNTHROID, LEVOTHROID) 175 MCG tablet, Take 125 mcg by mouth daily.  Marland Kitchen  thyroid (ARMOUR THYROID) 15 MG tablet, Take 1 tablet (15 mg total) by mouth daily.     Current Outpatient Medications (Hematological):  .  warfarin (COUMADIN) 5 MG tablet, Take 7.5 mg by mouth 3 (three) times a week.   Current Outpatient Medications (Other):  .  gabapentin (NEURONTIN) 100 MG capsule, Take 2 capsules (200 mg total) by mouth at bedtime. .  Vitamin D, Ergocalciferol, (DRISDOL) 50000 units CAPS capsule, Take 1 capsule (50,000 Units total) by mouth every 7 (seven) days. .  potassium chloride SA (K-DUR,KLOR-CON) 20 MEQ tablet, Take 1 tablet (20 mEq total) by mouth daily.    Past medical history, social, surgical and family history all reviewed in electronic medical record.  Molina pertanent information unless stated regarding to the chief complaint.   Review of Systems:  Molina headache, visual changes, nausea, vomiting, diarrhea, constipation, dizziness, abdominal pain, skin rash, fevers, chills, night sweats, weight loss, swollen lymph nodes,chest pain, shortness of breath, mood changes.  Positive fatigue, muscle aches, body aches, intermittent joint swelling  Objective  Blood pressure 110/82, pulse (!) 51, height 5' (1.524 m), weight 141 lb (64 kg), SpO2 98 %.    General: Molina apparent distress alert and oriented x3 mood and affect normal, dressed appropriately.  HEENT: Pupils equal, extraocular movements intact  Respiratory: Patient's speak in full sentences and does not appear short of breath  Cardiovascular: Molina lower extremity edema, non tender, Molina erythema  Skin: Warm dry intact with Molina signs of infection or rash on extremities or on axial skeleton.  Abdomen: Soft nontender  Neuro: Cranial nerves II through XII are intact, neurovascularly intact in all extremities with 2+ DTRs and 2+ pulses.  Lymph: Molina lymphadenopathy of posterior or anterior cervical chain or axillae bilaterally.  Gait antalgic MSK:   Non tender with full range of motion and good stability and symmetric strength and tone of shoulders, elbows, wrist, hip, knee and ankles bilaterally.  The patient's strength and tone is significantly less than what would be appreciated. Neck: Inspection loss of lordosis. Molina palpable stepoffs. Negative Spurling's maneuver. Mild limitation in sidebending bilaterally Still significant weakness noted in the C8 distribution bilaterally Negative Hoffman sign bilaterally Reflexes normal Tightness of the trapezius bilaterally       Impression and Recommendations:     This case required  medical decision making of moderate complexity. The above documentation has been reviewed and is accurate and complete Laura Pulley, DO       Note: This dictation was prepared with Dragon dictation along with smaller phrase technology. Any transcriptional errors that result from this process are unintentional.

## 2017-09-21 ENCOUNTER — Other Ambulatory Visit (INDEPENDENT_AMBULATORY_CARE_PROVIDER_SITE_OTHER): Payer: Managed Care, Other (non HMO)

## 2017-09-21 ENCOUNTER — Encounter: Payer: Self-pay | Admitting: Family Medicine

## 2017-09-21 ENCOUNTER — Ambulatory Visit (INDEPENDENT_AMBULATORY_CARE_PROVIDER_SITE_OTHER): Payer: Managed Care, Other (non HMO) | Admitting: Family Medicine

## 2017-09-21 VITALS — BP 110/82 | HR 51 | Ht 60.0 in | Wt 141.0 lb

## 2017-09-21 DIAGNOSIS — M2550 Pain in unspecified joint: Secondary | ICD-10-CM

## 2017-09-21 DIAGNOSIS — E039 Hypothyroidism, unspecified: Secondary | ICD-10-CM | POA: Diagnosis not present

## 2017-09-21 DIAGNOSIS — M4802 Spinal stenosis, cervical region: Secondary | ICD-10-CM | POA: Diagnosis not present

## 2017-09-21 DIAGNOSIS — E876 Hypokalemia: Secondary | ICD-10-CM | POA: Diagnosis not present

## 2017-09-21 LAB — CBC WITH DIFFERENTIAL/PLATELET
Basophils Absolute: 0.1 10*3/uL (ref 0.0–0.1)
Basophils Relative: 1.2 % (ref 0.0–3.0)
EOS PCT: 1.8 % (ref 0.0–5.0)
Eosinophils Absolute: 0.1 10*3/uL (ref 0.0–0.7)
HEMATOCRIT: 40.4 % (ref 36.0–46.0)
Hemoglobin: 13.7 g/dL (ref 12.0–15.0)
LYMPHS PCT: 23.6 % (ref 12.0–46.0)
Lymphs Abs: 1.9 10*3/uL (ref 0.7–4.0)
MCHC: 33.8 g/dL (ref 30.0–36.0)
MCV: 83.5 fl (ref 78.0–100.0)
MONOS PCT: 7.2 % (ref 3.0–12.0)
Monocytes Absolute: 0.6 10*3/uL (ref 0.1–1.0)
Neutro Abs: 5.4 10*3/uL (ref 1.4–7.7)
Neutrophils Relative %: 66.2 % (ref 43.0–77.0)
Platelets: 229 10*3/uL (ref 150.0–400.0)
RBC: 4.84 Mil/uL (ref 3.87–5.11)
RDW: 15.1 % (ref 11.5–15.5)
WBC: 8.2 10*3/uL (ref 4.0–10.5)

## 2017-09-21 LAB — TSH: TSH: 91.66 u[IU]/mL — AB (ref 0.35–4.50)

## 2017-09-21 LAB — COMPREHENSIVE METABOLIC PANEL
ALK PHOS: 48 U/L (ref 39–117)
ALT: 15 U/L (ref 0–35)
AST: 17 U/L (ref 0–37)
Albumin: 4.4 g/dL (ref 3.5–5.2)
BUN: 12 mg/dL (ref 6–23)
CO2: 30 mEq/L (ref 19–32)
Calcium: 9.5 mg/dL (ref 8.4–10.5)
Chloride: 101 mEq/L (ref 96–112)
Creatinine, Ser: 0.91 mg/dL (ref 0.40–1.20)
GFR: 68.14 mL/min (ref >60.00)
Glucose, Bld: 89 mg/dL (ref 70–99)
POTASSIUM: 2.8 meq/L — AB (ref 3.5–5.1)
Sodium: 140 mEq/L (ref 135–145)
TOTAL PROTEIN: 7.4 g/dL (ref 6.0–8.3)
Total Bilirubin: 0.5 mg/dL (ref 0.2–1.2)

## 2017-09-21 LAB — URIC ACID: Uric Acid, Serum: 4.9 mg/dL (ref 2.4–7.0)

## 2017-09-21 LAB — VITAMIN D 25 HYDROXY (VIT D DEFICIENCY, FRACTURES): VITD: 23.36 ng/mL — ABNORMAL LOW (ref 30.00–100.00)

## 2017-09-21 LAB — SEDIMENTATION RATE: SED RATE: 31 mm/h — AB (ref 0–30)

## 2017-09-21 LAB — C-REACTIVE PROTEIN: CRP: 0.1 mg/dL — AB (ref 0.5–20.0)

## 2017-09-21 LAB — IBC PANEL
Iron: 47 ug/dL (ref 42–145)
Saturation Ratios: 11 % — ABNORMAL LOW (ref 20.0–50.0)
Transferrin: 304 mg/dL (ref 212.0–360.0)

## 2017-09-21 MED ORDER — POTASSIUM CHLORIDE CRYS ER 20 MEQ PO TBCR: 20.0000 meq | EXTENDED_RELEASE_TABLET | Freq: Every day | ORAL | 3 refills | Status: AC

## 2017-09-21 MED ORDER — THYROID 15 MG PO TABS: 15.0000 mg | ORAL_TABLET | Freq: Every day | ORAL | 1 refills | Status: AC

## 2017-09-21 NOTE — Patient Instructions (Signed)
Good to see you  Alvera Singh is your friend.  Stay active Labs downstairs today  See me again after the mission trip and will consider injection

## 2017-09-21 NOTE — Assessment & Plan Note (Signed)
Started on 20 mEq of potassium.

## 2017-09-21 NOTE — Assessment & Plan Note (Signed)
Since patient has been here in some labs and came back.  TSH of 91.  Sent to endocrinology.  Started on Armour with patient already on 125 mcg of Synthroid that patient states she is taking regularly.

## 2017-09-21 NOTE — Assessment & Plan Note (Signed)
Responded to epidural.  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  Seems to be bilateral.  We discussed that possible injections.  Would like laboratory work-up to further evaluate as well.  Discussed icing regimen and home exercise.  Patient started on a low dose of gabapentin.  The pending laboratory work-up will discuss further.

## 2017-09-22 ENCOUNTER — Other Ambulatory Visit (INDEPENDENT_AMBULATORY_CARE_PROVIDER_SITE_OTHER): Payer: Managed Care, Other (non HMO)

## 2017-09-22 DIAGNOSIS — E039 Hypothyroidism, unspecified: Secondary | ICD-10-CM | POA: Diagnosis not present

## 2017-09-22 LAB — T4, FREE: Free T4: 0.49 ng/dL — ABNORMAL LOW (ref 0.60–1.60)

## 2017-09-22 LAB — T3, FREE: T3 FREE: 2.1 pg/mL — AB (ref 2.3–4.2)

## 2017-09-23 LAB — CYCLIC CITRUL PEPTIDE ANTIBODY, IGG: Cyclic Citrullin Peptide Ab: <16 UNITS

## 2017-09-23 LAB — ANA: Anti Nuclear Antibody(ANA): NEGATIVE

## 2017-09-23 LAB — PTH, INTACT AND CALCIUM
CALCIUM: 9.1 mg/dL (ref 8.6–10.4)
PTH: 36 pg/mL (ref 14–64)

## 2017-09-23 LAB — CALCIUM, IONIZED: Calcium, Ion: 5.01 mg/dL (ref 4.8–5.6)

## 2017-09-23 LAB — ANGIOTENSIN CONVERTING ENZYME: ANGIOTENSIN-CONVERTING ENZYME: 18 U/L (ref 9–67)

## 2017-09-23 LAB — RHEUMATOID FACTOR: Rhuematoid fact SerPl-aCnc: <14 IU/mL (ref <14)

## 2017-10-12 ENCOUNTER — Ambulatory Visit: Payer: Self-pay

## 2017-10-12 ENCOUNTER — Ambulatory Visit (INDEPENDENT_AMBULATORY_CARE_PROVIDER_SITE_OTHER): Payer: Managed Care, Other (non HMO) | Admitting: Family Medicine

## 2017-10-12 VITALS — BP 102/78 | HR 79 | Ht 60.0 in | Wt 141.0 lb

## 2017-10-12 DIAGNOSIS — M25529 Pain in unspecified elbow: Secondary | ICD-10-CM

## 2017-10-12 DIAGNOSIS — M4802 Spinal stenosis, cervical region: Secondary | ICD-10-CM | POA: Diagnosis not present

## 2017-10-12 DIAGNOSIS — G5621 Lesion of ulnar nerve, right upper limb: Secondary | ICD-10-CM | POA: Diagnosis not present

## 2017-10-12 DIAGNOSIS — G5601 Carpal tunnel syndrome, right upper limb: Secondary | ICD-10-CM

## 2017-10-12 NOTE — Assessment & Plan Note (Signed)
Continue to monitor

## 2017-10-12 NOTE — Assessment & Plan Note (Signed)
Patient given injection today.  Tolerated the procedure well.  Does have some degenerative spinal stenosis of the neck as well.  Discussed with patient symptoms will be beneficial.  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  Attempted knee injection for some relief.  Follow-up again 4 to 6 weeks may need to repeat contralateral side.

## 2017-10-12 NOTE — Progress Notes (Signed)
[facility] [address] [place], [place] [address] Phone: [phone] Subjective:   Laura Molina, am serving as a scribe for Dr. Hulan Saas.  I'm seeing this patient by the request  of:    CC: Neck pain, bilateral arm pain  [id number]  Laura Molina is a 57 y.o. female coming in with complaint of neck pain. She said that the epidural did help alleviate her pain. She does still drop things as she feels like her hands do not have strength or sensation. Did get adjustable standing desk.  Patient continues to have difficulty.  EMG did not show that patient has bilateral ulnar neuropathy.  Attempted a epidural injection of the cervical spine that did help with her neck pain but not with the weakness in her hands.  We have attempted a carpal tunnel injection which also only helped patient 5 to 10%.  Past Medical History:  Diagnosis Date  . Clotting disorder ([place])    RH negative 8  . Deep vein thrombosis (DVT) ([facility])    several over the years  . Depression    not taking medicine   . Dizziness   . Family history of anesthesia complication    family hx nausea and vomiting  . Fibroid   . GERD (gastroesophageal reflux disease)   . Headache   . Hypothyroidism   . Numbness in both hands    tendonitis  . PONV (postoperative nausea and vomiting)    difficulty waking up   . Thyroid disease    Past Surgical History:  Procedure Laterality Date  . ABDOMINAL HYSTERECTOMY    . 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 CESAREAN SECTION     x 3  . CHOLECYSTECTOMY    . COLONOSCOPY WITH PROPOFOL N/A 01/03/2013   Procedure: COLONOSCOPY WITH PROPOFOL;  Surgeon: Garlan Fair, MD;  Location: [facility];  Service: Endoscopy;  Laterality: N/A;  . LAPAROSCOPIC GASTRIC BANDING  04/06/06  . OOPHORECTOMY    . TENDON RELEASE     bilateral  elbow  . TONSILLECTOMY  as child  . ulnar nerve release Right jan 2014  . VENA CAVA FILTER PLACEMENT     left  groin   Social History   Socioeconomic History  . Marital status: Married    Spouse name: Not on file  . Number of children: Not on file  . Years of education: Not on file  . Highest education level: Not on file  Occupational History  . Not on file  Social Needs  . Financial resource strain: Not on file  . Food insecurity:    Worry: Not on file    Inability: Not on file  . Transportation needs:    Medical: Not on file    Non-medical: Not on file  Tobacco Use  . Smoking status: Former Smoker    Packs/day: 1.00    Years: 4.00    Pack years: 4.00    Types: Cigarettes    Last attempt to quit: 01/19/1993    Years since quitting: 24.7  . Smokeless tobacco: Never Used  Substance and Sexual Activity  . Alcohol use: Molina  . Drug use: Molina  . Sexual activity: Not on file  Lifestyle  . Physical activity:    Days per week: Not on file    Minutes per session: Not on file  .  Stress: Not on file  Relationships  . Social connections:    Talks on phone: Not on file    Gets together: Not on file    Attends religious service: Not on file    Active member of club or organization: Not on file    Attends meetings of clubs or organizations: Not on file    Relationship status: Not on file  Other Topics Concern  . Not on file  Social History Narrative  . Not on file   Allergies  Allergen Reactions  . Aspirin Other (See Comments)    DRUG INTERACTION: Patient on Coumadin  . Latex Rash  . Motrin [Ibuprofen] Rash   Family History  Problem Relation Age of Onset  . Diabetes Mellitus II Mother   . CAD Mother   . Stroke Mother   . Cirrhosis Mother        Hepatitis C  . Diabetes Mellitus II Father   . CAD Father     Current Outpatient Medications (Endocrine & Metabolic):  .  levothyroxine (SYNTHROID, LEVOTHROID) 175 MCG tablet, Take 125 mcg by mouth daily.  Marland Kitchen   thyroid (ARMOUR THYROID) 15 MG tablet, Take 1 tablet (15 mg total) by mouth daily.     Current Outpatient Medications (Hematological):  .  warfarin (COUMADIN) 5 MG tablet, Take 7.5 mg by mouth 3 (three) times a week.   Current Outpatient Medications (Other):  .  gabapentin (NEURONTIN) 100 MG capsule, Take 2 capsules (200 mg total) by mouth at bedtime. .  potassium chloride SA (K-DUR,KLOR-CON) 20 MEQ tablet, Take 1 tablet (20 mEq total) by mouth daily. .  Vitamin D, Ergocalciferol, (DRISDOL) 50000 units CAPS capsule, Take 1 capsule (50,000 Units total) by mouth every 7 (seven) days.    Past medical history, social, surgical and family history all reviewed in electronic medical record.  Molina pertanent information unless stated regarding to the chief complaint.   Review of Systems:  Molina headache, visual changes, nausea, vomiting, diarrhea, constipation, dizziness, abdominal pain, skin rash, fevers, chills, night sweats, weight loss, swollen lymph nodes, body aches, joint swelling,  chest pain, shortness of breath, mood changes.  Positive muscle aches  Objective  Blood pressure 102/78, pulse 79, height 5' (1.524 m), weight 141 lb (64 kg), SpO2 98 %.   General: Molina apparent distress alert and oriented x3 mood and affect normal, dressed appropriately.  HEENT: Pupils equal, extraocular movements intact  Respiratory: Patient's speak in full sentences and does not appear short of breath  Cardiovascular: Molina lower extremity edema, non tender, Molina erythema  Skin: Warm dry intact with Molina signs of infection or rash on extremities or on axial skeleton.  Abdomen: Soft nontender  Neuro: Cranial nerves II through XII are intact, neurovascularly intact in all extremities with 2+ DTRs and 2+ pulses.  Lymph: Molina lymphadenopathy of posterior or anterior cervical chain or axillae bilaterally.  Gait normal with good balance and coordination.  MSK:  Non tender with full range of motion and good stability and  symmetric strength and tone of shoulders, hip, knee and ankles bilaterally.  Patient does have surgical changes of the elbows bilaterally.  Tenderness with Tinel's over the medial area.  Lacks last 5 degrees of extension.  His exam still shows a positive Spurling's.  Grip strength shows the patient does have weakness in the ulnar distribution.  Significant weakness in the C8 distribution bilaterally right greater than left.  Procedure: Real-time Ultrasound Guided Injection of right ulnar nerve sheath near elbow status  post transposition Device: GE Logiq Q7 Ultrasound guided injection is preferred based studies that show increased duration, increased effect, greater accuracy, decreased procedural pain, increased response rate, and decreased cost with ultrasound guided versus blind injection.  Verbal informed consent obtained.  Time-out conducted.  Noted Molina overlying erythema, induration, or other signs of local infection.  Skin prepped in a sterile fashion.  Local anesthesia: Topical Ethyl chloride.  With sterile technique and under real time ultrasound guidance: With a 25-gauge half inch needle injected with 0.5 cc of 0.5% Marcaine and 0.5 cc of Kenalog 40 mg/mL Completed without difficulty  Pain immediately resolved suggesting accurate placement of the medication.  Advised to call if fevers/chills, erythema, induration, drainage, or persistent bleeding.  Images permanently stored and available for review in the ultrasound unit.  Impression: Technically successful ultrasound guided injection.    Impression and Recommendations:     This case required medical decision making of moderate complexity. The above documentation has been reviewed and is accurate and complete Lyndal Pulley, DO       Note: This dictation was prepared with Dragon dictation along with smaller phrase technology. Any transcriptional errors that result from this process are unintentional.

## 2017-10-12 NOTE — Assessment & Plan Note (Signed)
Also could be contributing.  Patient has had difficulty.  Did respond a little bit to the epidural.  Discussed home exercises.  Follow-up again in 4 to 8 weeks

## 2017-10-12 NOTE — Patient Instructions (Addendum)
Good to see you  Tried to inject the right ulnar nerve today  Ice is your friend.  If it helps great and we can do the left side.  I am hoping it will give you some help.  Increase armour to 30 mg daily  See me again in 3 weeks if we want to do the other side.

## 2017-11-02 NOTE — Progress Notes (Signed)
[name] [address] [place], [place] [address] Phone: [phone] Subjective:   [name], am serving as a scribe for Dr. Hulan Saas.  I'm seeing this patient by the request  of:    CC: Right wrist pain and right elbow pain follow-up  [id number]  Laura Molina is a 57 y.o. female coming in with complaint of right wrist pain. She continues to have pain in the hand and wrist. The injection alleviated her pain for 2 days. Also complains of similar pain in the left wrist.  Patient has had significant work-up.  First and seem to be more cervical radiculopathy but injections did not seem to be drastic improvement except in the neck pain.  Patient went for an EMG that did show an ulnar nerve injury that is likely secondary to patient's transposition.  Attempted injection in the cubital fossa with no significant benefit except for 1 week of may be a decrease of 50% pain and now it is back again.  Continues to have the weakness in the small finger and ring finger bilaterally right greater than left.  Pain also in the cervical spine today. She wonders if this is from a weather change as she feels worse overall today.      Past Medical History:  Diagnosis Date  . Clotting disorder ([place])    RH negative 8  . Deep vein thrombosis (DVT) ([facility])    several over the years  . Depression    not taking medicine   . Dizziness   . Family history of anesthesia complication    family hx nausea and vomiting  . Fibroid   . GERD (gastroesophageal reflux disease)   . Headache   . Hypothyroidism   . Numbness in both hands    tendonitis  . PONV (postoperative nausea and vomiting)    difficulty waking up   . Thyroid disease    Past Surgical History:  Procedure Laterality Date  . ABDOMINAL HYSTERECTOMY    . CERVICAL DISC ARTHROPLASTY N/A 09/20/2014   Procedure: TOTAL DISC REPLACEMENT C5-6;  Surgeon: Melina Schools, MD;  Location: [facility];  Service: Orthopedics;   Laterality: N/A;  . CESAREAN SECTION     x 3  . CHOLECYSTECTOMY    . COLONOSCOPY WITH PROPOFOL N/A 01/03/2013   Procedure: COLONOSCOPY WITH PROPOFOL;  Surgeon: Garlan Fair, MD;  Location: [facility];  Service: Endoscopy;  Laterality: N/A;  . LAPAROSCOPIC GASTRIC BANDING  04/06/06  . OOPHORECTOMY    . TENDON RELEASE     bilateral  elbow  . TONSILLECTOMY  as child  . ulnar nerve release Right jan 2014  . VENA CAVA FILTER PLACEMENT     left  groin   Social History   Socioeconomic History  . Marital status: Married    Spouse name: Not on file  . Number of children: Not on file  . Years of education: Not on file  . Highest education level: Not on file  Occupational History  . Not on file  Social Needs  . Financial resource strain: Not on file  . Food insecurity:    Worry: Not on file    Inability: Not on file  . Transportation needs:    Medical: Not on file    Non-medical: Not on file  Tobacco Use  . Smoking status: Former Smoker    Packs/day: 1.00    Years: 4.00    Pack years: 4.00    Types: Cigarettes  Last attempt to quit: 01/19/1993    Years since quitting: 24.8  . Smokeless tobacco: Never Used  Substance and Sexual Activity  . Alcohol use: No  . Drug use: No  . Sexual activity: Not on file  Lifestyle  . Physical activity:    Days per week: Not on file    Minutes per session: Not on file  . Stress: Not on file  Relationships  . Social connections:    Talks on phone: Not on file    Gets together: Not on file    Attends religious service: Not on file    Active member of club or organization: Not on file    Attends meetings of clubs or organizations: Not on file    Relationship status: Not on file  Other Topics Concern  . Not on file  Social History Narrative  . Not on file   Allergies  Allergen Reactions  . Aspirin Other (See Comments)    DRUG INTERACTION: Patient on Coumadin  . Latex Rash  . Motrin [Ibuprofen] Rash   Family History  Problem  Relation Age of Onset  . Diabetes Mellitus II Mother   . CAD Mother   . Stroke Mother   . Cirrhosis Mother        Hepatitis C  . Diabetes Mellitus II Father   . CAD Father     Current Outpatient Medications (Endocrine & Metabolic):  .  levothyroxine (SYNTHROID, LEVOTHROID) 175 MCG tablet, Take 125 mcg by mouth daily.  Marland Kitchen  thyroid (ARMOUR THYROID) 15 MG tablet, Take 1 tablet (15 mg total) by mouth daily.     Current Outpatient Medications (Hematological):  .  warfarin (COUMADIN) 5 MG tablet, Take 7.5 mg by mouth 3 (three) times a week.   Current Outpatient Medications (Other):  .  gabapentin (NEURONTIN) 100 MG capsule, Take 2 capsules (200 mg total) by mouth at bedtime. .  potassium chloride SA (K-DUR,KLOR-CON) 20 MEQ tablet, Take 1 tablet (20 mEq total) by mouth daily. .  Vitamin D, Ergocalciferol, (DRISDOL) 50000 units CAPS capsule, Take 1 capsule (50,000 Units total) by mouth every 7 (seven) days.    Past medical history, social, surgical and family history all reviewed in electronic medical record.  No pertanent information unless stated regarding to the chief complaint.   Review of Systems:  No headache, visual changes, nausea, vomiting, diarrhea, constipation, dizziness, abdominal pain, skin rash, fevers, chills, night sweats, weight loss, swollen lymph nodes, body aches, joint swelling,  chest pain, shortness of breath, mood changes.  Positive muscle aches  Objective  Blood pressure 112/82, pulse 87, height 5' (1.524 m), weight 143 lb (64.9 kg), SpO2 98 %.    General: No apparent distress alert and oriented x3 mood and affect normal, dressed appropriately.  HEENT: Pupils equal, extraocular movements intact  Respiratory: Patient's speak in full sentences and does not appear short of breath  Cardiovascular: No lower extremity edema, non tender, no erythema  Skin: Warm dry intact with no signs of infection or rash on extremities or on axial skeleton.  Abdomen: Soft  nontender  Neuro: Cranial nerves II through XII are intact, neurovascularly intact in all extremities with 2+ DTRs and 2+ pulses.  Lymph: No lymphadenopathy of posterior or anterior cervical chain or axillae bilaterally.  Gait normal with good balance and coordination.  MSK:  Non tender with full range of motion and good stability and symmetric strength and tone of  hip, knee and ankles bilaterally.   Patient does have  weakness of the upper extremities bilaterally.  Does have hyperthenar eminence wasting.  Still has weakness in the C8 distribution of the hands bilaterally right greater than left.  Tenderness over the elbow.     Impression and Recommendations:     This case required medical decision making of moderate complexity. The above documentation has been reviewed and is accurate and complete Lyndal Pulley, DO       Note: This dictation was prepared with Dragon dictation along with smaller phrase technology. Any transcriptional errors that result from this process are unintentional.

## 2017-11-03 ENCOUNTER — Encounter: Payer: Self-pay | Admitting: Family Medicine

## 2017-11-03 ENCOUNTER — Ambulatory Visit (INDEPENDENT_AMBULATORY_CARE_PROVIDER_SITE_OTHER): Payer: Managed Care, Other (non HMO) | Admitting: Family Medicine

## 2017-11-03 DIAGNOSIS — G5621 Lesion of ulnar nerve, right upper limb: Secondary | ICD-10-CM

## 2017-11-03 NOTE — Assessment & Plan Note (Signed)
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  I discussed with her the only other treatment option at this time would be a PRP injection which be completely experimental especially when it comes to nerve regeneration.  We discussed other medications.  Increasing either the gabapentin or starting of the other long-term medications which patient does not feel will benefit her in the long-term.  Patient has known spinal stenosis of the cervical spine but the patient did not respond as well.  Also some mild carpal tunnel but also did not respond to injections.  We discussed with patient that this could be a long-term issue and do not know what other changes could be done except for potentially trying to change more of the thyroid.  Patient does not want to go ahead with the PRP and will be set up in the near future.  Spent  25 minutes with patient face-to-face and had greater than 50% of counseling including as described above in assessment and plan.

## 2017-11-03 NOTE — Patient Instructions (Signed)
Good to see you  Ice is your friend We will see you that Wed for the PRP! And hope fot the rest

## 2017-11-05 ENCOUNTER — Encounter: Payer: Self-pay | Admitting: Family Medicine

## 2017-11-16 ENCOUNTER — Ambulatory Visit: Payer: Managed Care, Other (non HMO) | Admitting: Internal Medicine

## 2017-11-16 DIAGNOSIS — Z0289 Encounter for other administrative examinations: Secondary | ICD-10-CM

## 2017-11-24 ENCOUNTER — Other Ambulatory Visit: Payer: Self-pay | Admitting: Orthopedic Surgery

## 2017-11-24 ENCOUNTER — Telehealth: Payer: Self-pay | Admitting: Nurse Practitioner

## 2017-11-24 DIAGNOSIS — M542 Cervicalgia: Secondary | ICD-10-CM

## 2017-11-24 NOTE — Telephone Encounter (Signed)
Phone call to patient to verify medication list and allergies for myelogram procedure. Pt informed she will need to hold coumadin for 4 days, pending approval from Dr. Delfina Redwood. Pt verbalized understanding. Faxed thinner hold request to Dr. Delfina Redwood, awaiting response.

## 2017-12-08 ENCOUNTER — Ambulatory Visit
Admission: RE | Admit: 2017-12-08 | Discharge: 2017-12-08 | Disposition: A | Discharge status: Home / Self Care | Payer: Managed Care, Other (non HMO) | Source: Ambulatory Visit | Attending: Orthopedic Surgery | Admitting: Orthopedic Surgery

## 2017-12-08 DIAGNOSIS — M542 Cervicalgia: Secondary | ICD-10-CM

## 2017-12-08 MED ORDER — DIAZEPAM 5 MG PO TABS
10.0000 mg | ORAL_TABLET | Freq: Once | ORAL | Status: AC
  Administered 2017-12-08: 5 mg via ORAL

## 2017-12-08 MED ORDER — IOPAMIDOL (ISOVUE-M 300) INJECTION 61%
10.0000 mL | Freq: Once | INTRAMUSCULAR | Status: AC | PRN
  Administered 2017-12-08: 10 mL via INTRATHECAL

## 2017-12-08 MED ORDER — ONDANSETRON HCL 4 MG/2ML IJ SOLN
4.0000 mg | Freq: Once | INTRAMUSCULAR | Status: AC
  Administered 2017-12-08: 4 mg via INTRAMUSCULAR

## 2017-12-08 MED ORDER — MEPERIDINE HCL 50 MG/ML IJ SOLN
50.0000 mg | Freq: Once | INTRAMUSCULAR | Status: AC
  Administered 2017-12-08: 50 mg via INTRAMUSCULAR

## 2017-12-08 NOTE — Discharge Instructions (Signed)

## 2017-12-15 ENCOUNTER — Ambulatory Visit: Payer: Managed Care, Other (non HMO) | Admitting: Family Medicine

## 2017-12-27 ENCOUNTER — Ambulatory Visit: Payer: Managed Care, Other (non HMO) | Admitting: Family

## 2017-12-28 ENCOUNTER — Other Ambulatory Visit: Payer: Self-pay

## 2017-12-28 ENCOUNTER — Emergency Department (HOSPITAL_COMMUNITY): Payer: Managed Care, Other (non HMO)

## 2017-12-28 ENCOUNTER — Emergency Department (HOSPITAL_COMMUNITY)
Admission: EM | Admit: 2017-12-28 | Discharge: 2017-12-28 | Disposition: A | Discharge status: Home / Self Care | Payer: Managed Care, Other (non HMO) | Attending: Emergency Medicine | Admitting: Emergency Medicine

## 2017-12-28 ENCOUNTER — Encounter (HOSPITAL_COMMUNITY): Payer: Self-pay | Admitting: Emergency Medicine

## 2017-12-28 DIAGNOSIS — R946 Abnormal results of thyroid function studies: Secondary | ICD-10-CM | POA: Diagnosis not present

## 2017-12-28 DIAGNOSIS — Z7901 Long term (current) use of anticoagulants: Secondary | ICD-10-CM | POA: Insufficient documentation

## 2017-12-28 DIAGNOSIS — Z79899 Other long term (current) drug therapy: Secondary | ICD-10-CM | POA: Diagnosis not present

## 2017-12-28 DIAGNOSIS — E039 Hypothyroidism, unspecified: Secondary | ICD-10-CM | POA: Insufficient documentation

## 2017-12-28 DIAGNOSIS — G629 Polyneuropathy, unspecified: Secondary | ICD-10-CM | POA: Diagnosis not present

## 2017-12-28 DIAGNOSIS — R4182 Altered mental status, unspecified: Secondary | ICD-10-CM | POA: Diagnosis present

## 2017-12-28 DIAGNOSIS — R791 Abnormal coagulation profile: Secondary | ICD-10-CM | POA: Insufficient documentation

## 2017-12-28 DIAGNOSIS — Z87891 Personal history of nicotine dependence: Secondary | ICD-10-CM | POA: Diagnosis not present

## 2017-12-28 LAB — CBC
HCT: 42.1 % (ref 36.0–46.0)
HEMOGLOBIN: 13.5 g/dL (ref 12.0–15.0)
MCH: 27.6 pg (ref 26.0–34.0)
MCHC: 32.1 g/dL (ref 30.0–36.0)
MCV: 85.9 fL (ref 80.0–100.0)
NRBC: 0 % (ref 0.0–0.2)
Platelets: 205 10*3/uL (ref 150–400)
RBC: 4.9 MIL/uL (ref 3.87–5.11)
RDW: 13.4 % (ref 11.5–15.5)
WBC: 6.4 10*3/uL (ref 4.0–10.5)

## 2017-12-28 LAB — I-STAT CHEM 8, ED
BUN: 9 mg/dL (ref 6–20)
Calcium, Ion: 1.14 mmol/L — ABNORMAL LOW (ref 1.15–1.40)
Chloride: 108 mmol/L (ref 98–111)
Creatinine, Ser: 0.5 mg/dL (ref 0.44–1.00)
GLUCOSE: 138 mg/dL — AB (ref 70–99)
HCT: 42 % (ref 36.0–46.0)
Hemoglobin: 14.3 g/dL (ref 12.0–15.0)
POTASSIUM: 3.7 mmol/L (ref 3.5–5.1)
SODIUM: 142 mmol/L (ref 135–145)
TCO2: 24 mmol/L (ref 22–32)

## 2017-12-28 LAB — URINALYSIS, ROUTINE W REFLEX MICROSCOPIC
Bilirubin Urine: NEGATIVE
GLUCOSE, UA: NEGATIVE mg/dL
HGB URINE DIPSTICK: NEGATIVE
KETONES UR: NEGATIVE mg/dL
NITRITE: NEGATIVE
PROTEIN: NEGATIVE mg/dL
Specific Gravity, Urine: 1.005 (ref 1.005–1.030)
pH: 5 (ref 5.0–8.0)

## 2017-12-28 LAB — T4, FREE: FREE T4: 1.05 ng/dL (ref 0.82–1.77)

## 2017-12-28 MED ORDER — HYDROCODONE-ACETAMINOPHEN 5-325 MG PO TABS: 1.0000 | ORAL_TABLET | ORAL | 0 refills | Status: AC | PRN

## 2017-12-28 MED ORDER — TRAMADOL HCL 50 MG PO TABS
50.0000 mg | ORAL_TABLET | Freq: Once | ORAL | Status: AC
  Administered 2017-12-28: 50 mg via ORAL
  Filled 2017-12-28: qty 1

## 2017-12-28 MED ORDER — CEPHALEXIN 500 MG PO CAPS: 500.0000 mg | ORAL_CAPSULE | Freq: Three times a day (TID) | ORAL | 0 refills | Status: AC

## 2017-12-28 MED ORDER — SODIUM CHLORIDE 0.9 % IV BOLUS
1000.0000 mL | Freq: Once | INTRAVENOUS | Status: AC
  Administered 2017-12-28: 1000 mL via INTRAVENOUS

## 2017-12-28 MED FILL — PHYTONADIONE 5 MG TABS: 5 | 2 days supply | Qty: 1 | Fill #0

## 2017-12-28 NOTE — ED Provider Notes (Signed)
[facility] EMERGENCY DEPARTMENT Provider Note   CSN: [id number] Arrival date & time: 12/28/17  1558     History   Chief Complaint Chief Complaint  Patient presents with  . Abnormal Lab  . Altered Mental Status    HPI Laura Molina is a 57 y.o. female.  HPI   57 yo F with h/o DVT on coumadin here with abnormal lab.  Patient presented to her PCP for clearance for ulnar nerve transposition.  She was noted to complain of mild, generalized confusion.  She feels like she has been intermittently dizzy and just "off" for the last week or 2.  She has not had any headaches or trauma.  She was noted to have elevated INR and lab yesterday.  She was given oral vitamin K.  Her INR was greater than 8 today so she was sent here.  She feels generally fatigued.  She is felt cold.  She denies any active bleeding.  No blood in her stools.  Her surgery is rescheduled for next week.  She denies any focal numbness or weakness.  No vision changes.  Denies any leg swelling.  Of note, her TSH was markedly elevated at her PCPs visit as well, which is new.  She is been taking her Synthroid as prescribed.  Past Medical History:  Diagnosis Date  . Clotting disorder ([place])    RH negative 8  . Deep vein thrombosis (DVT) ([facility])    several over the years  . Depression    not taking medicine   . Dizziness   . Family history of anesthesia complication    family hx nausea and vomiting  . Fibroid   . GERD (gastroesophageal reflux disease)   . Headache   . Hypothyroidism   . Numbness in both hands    tendonitis  . PONV (postoperative nausea and vomiting)    difficulty waking up   . Thyroid disease     Patient Active Problem List   Diagnosis Date Noted  . Ulnar neuropathy at elbow, right 10/12/2017  . Degenerative cervical spinal stenosis 08/18/2017  . Carpal tunnel syndrome on right 08/18/2017  . Chest pain 07/27/2016  . Neck pain 09/20/2014  . Hypernatremia   . Hypokalemia  11/22/2013  . History of DVT (deep vein thrombosis) 11/22/2013  . Hypothyroidism 11/22/2013  . Normocytic normochromic anemia 11/22/2013  . Abdominal discomfort   . Hx of laparoscopic adjustable gastric banding 04/09/2011    Past Surgical History:  Procedure Laterality Date  . ABDOMINAL HYSTERECTOMY    . CERVICAL DISC ARTHROPLASTY N/A 09/20/2014   Procedure: TOTAL DISC REPLACEMENT C5-6;  Surgeon: Melina Schools, MD;  Location: [facility];  Service: Orthopedics;  Laterality: N/A;  . CESAREAN SECTION     x 3  . CHOLECYSTECTOMY    . COLONOSCOPY WITH PROPOFOL N/A 01/03/2013   Procedure: COLONOSCOPY WITH PROPOFOL;  Surgeon: Garlan Fair, MD;  Location: [facility];  Service: Endoscopy;  Laterality: N/A;  . LAPAROSCOPIC GASTRIC BANDING  04/06/06  . OOPHORECTOMY    . TENDON RELEASE     bilateral  elbow  . TONSILLECTOMY  as child  . ulnar nerve release Right jan 2014  . VENA CAVA FILTER PLACEMENT     left  groin     OB History   None      Home Medications    Prior to Admission medications   Medication Sig Start Date End Date Taking? Authorizing Provider  gabapentin (NEURONTIN) 300 MG capsule Take 300  mg by mouth 2 (two) times daily. 12/27/17  Yes [provider]  potassium chloride SA (K-DUR,KLOR-CON) 20 MEQ tablet Take 1 tablet (20 mEq total) by mouth daily. 09/21/17  Yes Hulan Saas M, DO  SYNTHROID 125 MCG tablet Take 125 mcg by mouth daily. 12/09/17  Yes [provider]  warfarin (COUMADIN) 5 MG tablet Take 7.5 mg by mouth 3 (three) times a week.    Yes [provider]  cephALEXin (KEFLEX) 500 MG capsule Take 1 capsule (500 mg total) by mouth 3 (three) times daily for 5 days. 12/28/17 01/02/18  Duffy Bruce, MD  HYDROcodone-acetaminophen (NORCO/VICODIN) 5-325 MG tablet Take 1-2 tablets by mouth every 4 (four) hours as needed for moderate pain or severe pain. 12/28/17   Duffy Bruce, MD  thyroid [facility] THYROID) 15 MG tablet Take 1 tablet (15 mg  total) by mouth daily. Patient not taking: Reported on 11/24/2017 09/21/17   Lyndal Pulley, DO  Vitamin D, Ergocalciferol, (DRISDOL) 50000 units CAPS capsule Take 1 capsule (50,000 Units total) by mouth every 7 (seven) days. Patient not taking: Reported on 12/28/2017 08/18/17   Lyndal Pulley, DO  fluticasone [facility]) 50 MCG/ACT nasal spray Place 2 sprays into the nose daily as needed. For allergies    04/09/11  [provider]    Family History Family History  Problem Relation Age of Onset  . Diabetes Mellitus II Mother   . CAD Mother   . Stroke Mother   . Cirrhosis Mother        Hepatitis C  . Diabetes Mellitus II Father   . CAD Father     Social History Social History   Tobacco Use  . Smoking status: Former Smoker    Packs/day: 1.00    Years: 4.00    Pack years: 4.00    Types: Cigarettes    Last attempt to quit: 01/19/1993    Years since quitting: 24.9  . Smokeless tobacco: Never Used  Substance Use Topics  . Alcohol use: No  . Drug use: No     Allergies   Aspirin; Latex; and Motrin [ibuprofen]   Review of Systems Review of Systems  Constitutional: Positive for fatigue. Negative for chills and fever.  HENT: Negative for congestion and rhinorrhea.   Eyes: Negative for visual disturbance.  Respiratory: Negative for cough, shortness of breath and wheezing.   Cardiovascular: Negative for chest pain and leg swelling.  Gastrointestinal: Negative for abdominal pain, diarrhea and vomiting.  Genitourinary: Negative for dysuria and flank pain.  Musculoskeletal: Negative for neck pain and neck stiffness.  Skin: Negative for rash and wound.  Allergic/Immunologic: Negative for immunocompromised state.  Neurological: Positive for dizziness and weakness. Negative for syncope and headaches.  All other systems reviewed and are negative.    Physical Exam Updated Vital Signs BP 109/76 (BP Location: Right Arm)   Pulse 63   Temp 98.7 F (37.1 C) (Oral)   Resp 19    Ht 5' (1.524 m)   Wt 62.1 kg   SpO2 100%   BMI 26.76 kg/m   Physical Exam  Constitutional: She is oriented to person, place, and time. She appears well-developed and well-nourished. No distress.  HENT:  Head: Normocephalic and atraumatic.  Eyes: Conjunctivae are normal.  Neck: Neck supple.  Cardiovascular: Normal rate, regular rhythm and normal heart sounds. Exam reveals no friction rub.  No murmur heard. Pulmonary/Chest: Effort normal and breath sounds normal. No respiratory distress. She has no wheezes. She has no rales.  Abdominal: She exhibits no distension.  Musculoskeletal: She exhibits no edema.  Neurological: She is alert and oriented to person, place, and time. She exhibits normal muscle tone.  Skin: Skin is warm. Capillary refill takes less than 2 seconds.  Psychiatric: She has a normal mood and affect.  Nursing note and vitals reviewed.   Neurological Exam:  Mental Status: Alert and oriented to person, place, and time. Attention and concentration normal. Speech clear. Recent memory is intact. Cranial Nerves: Visual fields grossly intact. EOMI and PERRLA. No nystagmus noted. Facial sensation intact at forehead, maxillary cheek, and chin/mandible bilaterally. No facial asymmetry or weakness. Hearing grossly normal. Uvula is midline, and palate elevates symmetrically. Normal SCM and trapezius strength. Tongue midline without fasciculations. Motor: Muscle strength 5/5 in proximal and distal UE and LE bilaterally. No pronator drift. Muscle tone normal. Reflexes: 2+ and symmetrical in all four extremities.  Sensation: Intact to light touch in upper and lower extremities distally bilaterally.  Gait: Normal without ataxia. Coordination: Normal FTN bilaterally.     ED Treatments / Results  Labs (all labs ordered are listed, but only abnormal results are displayed) Labs Reviewed  PROTIME-INR - Abnormal; Notable for the following components:      Result Value   Prothrombin  Time 45.9 (*)    INR 5.04 (*)    All other components within normal limits  URINALYSIS, ROUTINE W REFLEX MICROSCOPIC - Abnormal; Notable for the following components:   Color, Urine STRAW (*)    Leukocytes, UA TRACE (*)    Bacteria, UA MANY (*)    All other components within normal limits  I-STAT CHEM 8, ED - Abnormal; Notable for the following components:   Glucose, Bld 138 (*)    Calcium, Ion 1.14 (*)    All other components within normal limits  T4, FREE  CBC    EKG EKG Interpretation  Date/Time:  Tuesday December 28 2017 16:10:03 EST Ventricular Rate:  71 PR Interval:  130 QRS Duration: 96 QT Interval:  416 QTC Calculation: 452 R Axis:   67 Text Interpretation:  Normal sinus rhythm with sinus arrhythmia Low voltage QRS Borderline ECG No significant change since last tracing Confirmed by Duffy Bruce [phone]) on 12/28/2017 5:09:47 PM   Radiology Ct Head Wo Contrast  Result Date: 12/28/2017 CLINICAL DATA:  Increasing confusion EXAM: CT HEAD WITHOUT CONTRAST TECHNIQUE: Contiguous axial images were obtained from the base of the skull through the vertex without intravenous contrast. COMPARISON:  None. FINDINGS: Brain: No evidence of acute infarction, hemorrhage, hydrocephalus, extra-axial collection or mass lesion/mass effect. Vascular: No hyperdense vessel or unexpected calcification. Skull: Normal. Negative for fracture or focal lesion. Sinuses/Orbits: No acute finding. Other: None. IMPRESSION: Normal head CT Electronically Signed   By: Inez Catalina M.D.   On: 12/28/2017 20:32    Procedures Procedures (including critical care time)  Medications Ordered in ED Medications  sodium chloride 0.9 % bolus 1,000 mL (0 mLs Intravenous Stopped 12/28/17 1909)  traMADol (ULTRAM) tablet 50 mg (50 mg Oral Given 12/28/17 2155)     Initial Impression / Assessment and Plan / ED Course  I have reviewed the triage vital signs and the nursing notes.  Pertinent labs & imaging results  that were available during my care of the patient were reviewed by me and considered in my medical decision making (see chart for details).     57 yo F here w/ generalized weakness, fatigue, as well as supratherapeutic INR. Regarding her generalized weakness - this has  been an ongoing issue, with h/o fatigue related to thyroid issues. Lab work today shows no significant abnormalities. UA does show some bacteriuria with sx of dysuria last week so will tx, but otherwise no significant abnormalities. No anemia. EKG non-ischemic. Will refer her to her PCP and possible endocrinology. Regarding the reason she was sent here - her supratherapeutic INR and reported confusion - her BP, VS are all stable. CT head is negative. INR 5, likely already responding to vit K. No signs of active bleeding. Will have her continue to monitor with her PCP.  Final Clinical Impressions(s) / ED Diagnoses   Final diagnoses:  Abnormal thyroid function test  Elevated INR  Neuropathy    ED Discharge Orders         Ordered    HYDROcodone-acetaminophen (NORCO/VICODIN) 5-325 MG tablet  Every 4 hours PRN     12/28/17 2218    cephALEXin (KEFLEX) 500 MG capsule  3 times daily     12/28/17 2218           Duffy Bruce, MD 12/29/17 0151

## 2017-12-28 NOTE — ED Triage Notes (Signed)
Pt reports increased intermittent confusion over the last couple of weeks. Pt went to her doctors office today for surgery tomorrow on her ulnar nerve. Pt's INR was elevated higher than 8. Pt's Coumadin was held and doctors office gave her 5mg  of Vitamin K today. A&Ox4.

## 2017-12-28 NOTE — ED Notes (Signed)
CRITICAL VALUE ALERT  Critical Value:  inr 5.04  Date & Time Notied:  12/28/17 @ [place]  Provider Notified: MD aware  Orders Received/Actions taken: n/a

## 2017-12-28 NOTE — ED Notes (Signed)
Paperwork faxed from MD Polite's office. Labs included.

## 2017-12-28 NOTE — ED Notes (Signed)
Blood work held per MD Eulis Foster to receive fax of labs from pt's doctor's office.

## 2017-12-29 LAB — PROTIME-INR
INR: 5.04 — AB
Prothrombin Time: 45.9 seconds — ABNORMAL HIGH (ref 11.4–15.2)

## 2018-08-06 IMAGING — CT CT HEART MORP W/ CTA COR W/ SCORE W/ CA W/CM &/OR W/O CM
4 of 7 series · 8 of 20 positions shown, 9 images · IV contrast (APPLIED)
Comparison: Chest x-ray on 07/27/2016

CLINICAL DATA: Chest pain

EXAM:
Cardiac CTA
MEDICATIONS:
Sub lingual nitro. 4mg
TECHNIQUE: The patient was scanned on a Siemens Force [REDACTED]ice scanner. Gantry
rotation speed was 250 msecs. Collimation was..6 mm . A 100 kV
prospective scan was triggered in the ascending thoracic aorta at
140 HU's . Average HR during the scan was 80 bpm. The 3D data set
was interpreted on a dedicated work station using MPR, MIP and VRT
modes. A total of 80cc of contrast was used.

[Series 7: best syst 355 ms · axial · 0.35mm/px · z∈[+1580,+1623]mm · 2 of 326 slices shown, 3 images]
[im 109/326  vessel]
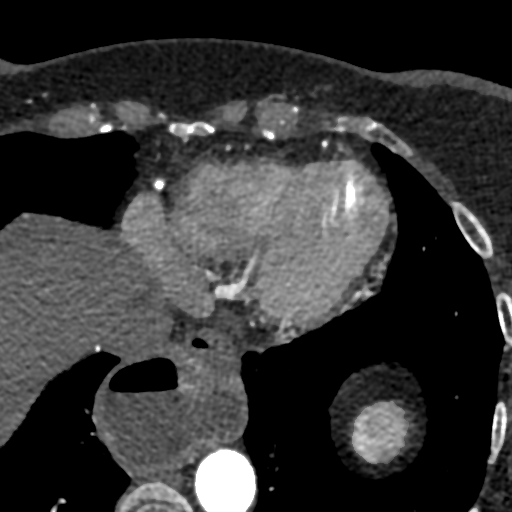
[im 109/326  lung]
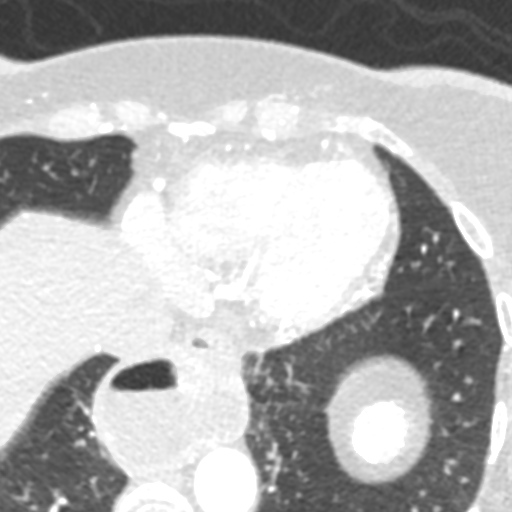
[im 217/326  vessel]
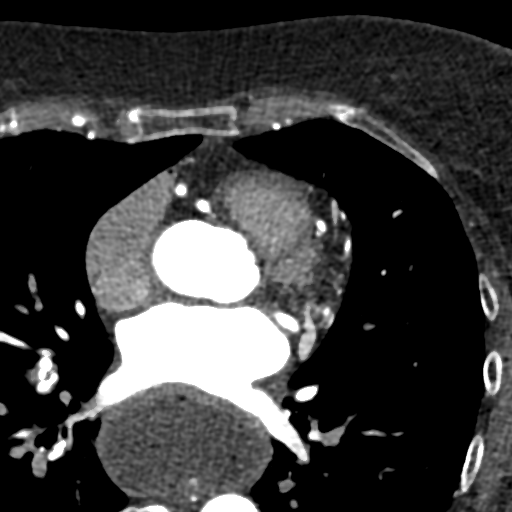

[Series 8: best diast 78 % · axial · 0.35mm/px · z∈[+1580,+1623]mm · 2 of 326 slices shown]
[im 109/326  vessel]
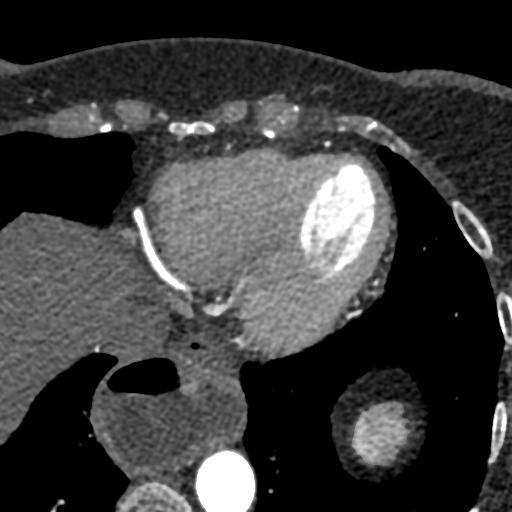
[im 217/326  vessel]
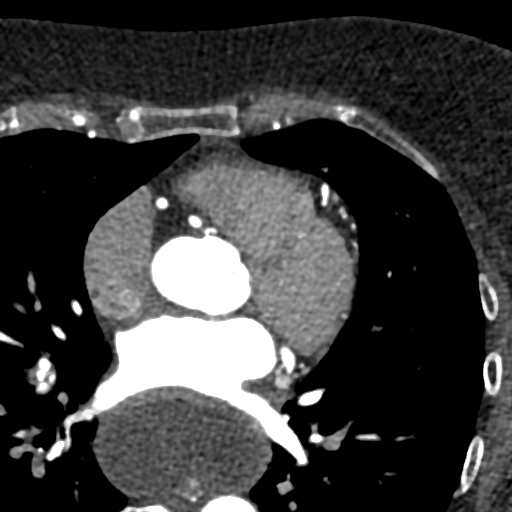

[Series 9: diast sharp 78 % · axial · 0.35mm/px · z∈[+1580,+1623]mm · 2 of 326 slices shown]
[im 109/326  lung]
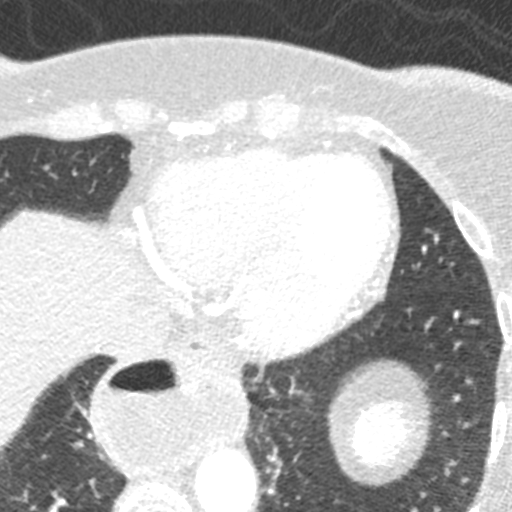
[im 217/326  lung]
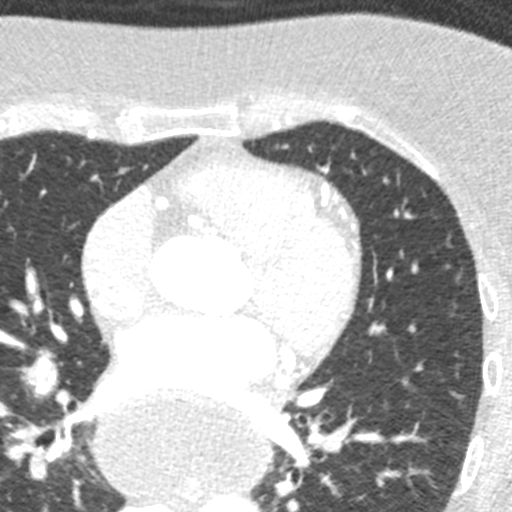

[Series 10: syst sharp 40 % · axial · 0.35mm/px · z∈[+1580,+1623]mm · 2 of 326 slices shown]
[im 109/326  lung]
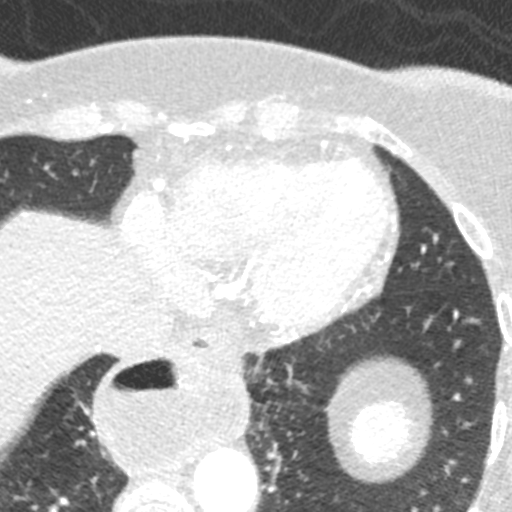
[im 217/326  lung]
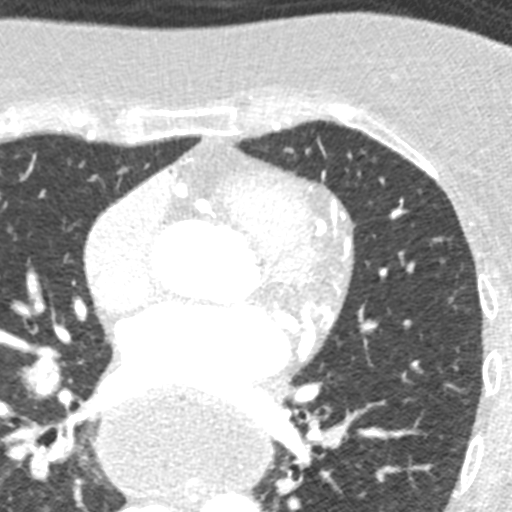

[8 of 20 positions shown; findings below may reference images not displayed]

FINDINGS: Non-cardiac: See separate report from [REDACTED]. No
significant findings on limited lung and soft tissue windows.

Calcium Score:  0

Coronary Arteries:Co- dominant with no anomalies

LM:  Normal

LAD:  Normal

D1:  Normal

D2:  Normal

Circumflex:  Normal

OM1:  Normal

OM2:  Normal

OM3/PDA:  Normal

RCA:  Co dominant normal

PDA:  Normal

PLA:  Normal
IMPRESSION: 1) Normal co-dominant coronary arteries

2) Calcium score 0

3) Normal aortic root 3.3 cm

4) See radiology report Suspect source of chest pain is massively
dilated retrocardiac esophagus likely complication from previous lap
band surgery

Maryri Mejia [place]

EXAM:
OVER-READ INTERPRETATION  CT CHEST

The following report is an over-read performed by radiologist Dr.
over-read does not include interpretation of cardiac or coronary
anatomy or pathology. The coronary calcium score/coronary CTA
interpretation by the cardiologist is attached.
FINDINGS: Cardiovascular: No significant vascular findings. Normal heart size.
No pericardial effusion.

Mediastinum/Nodes: The visualized mid to distal esophagus is
massively dilated and contains fluid. The dilated esophagus measures
up to 5.9 cm in transverse width. No mediastinal lymphadenopathy
identified. Multiple calcified lymph nodes in the right hilum are
consistent with prior granulomatous disease.

Lungs/Pleura: Visualized lungs show no evidence of infiltrates or
nodules. No edema or pleural fluid identified.

Upper Abdomen: Gastric band present. The presence of significant
esophageal dilatation proximal to the gastric band may implicate
poor emptying through the banded portion of the stomach. No obvious
signs of band erosion. The entire band and stomach are not
visualized.

Musculoskeletal: No chest wall mass or suspicious bone lesions
identified.
IMPRESSION: 1. Massively dilated mid to distal esophagus with evidence of prior
gastric banding. This may implicate poor emptying through the banded
portion of the stomach and correlation is suggested clinically.
2. Calcified right hilar lymph nodes consistent with prior
granulomatous disease.

## 2022-09-10 LAB — COLOGUARD: COLOGUARD: NEGATIVE

## 2022-12-24 ENCOUNTER — Institutional Professional Consult (permissible substitution): Payer: Managed Care, Other (non HMO) | Admitting: Plastic Surgery
